# Patient Record
Sex: Female | Born: 1965
Health system: Southern US, Community
[De-identification: ages and names within clinical notes are randomized; demographics above are authoritative.]

## PROBLEM LIST (undated history)

## (undated) DIAGNOSIS — I10 Essential (primary) hypertension: Secondary | ICD-10-CM

## (undated) DIAGNOSIS — R7303 Prediabetes: Secondary | ICD-10-CM

## (undated) DIAGNOSIS — G4730 Sleep apnea, unspecified: Secondary | ICD-10-CM

## (undated) DIAGNOSIS — E7800 Pure hypercholesterolemia, unspecified: Secondary | ICD-10-CM

## (undated) DIAGNOSIS — H409 Unspecified glaucoma: Secondary | ICD-10-CM

## (undated) HISTORY — DX: Essential (primary) hypertension: I10

## (undated) HISTORY — PX: CATARACT EXTRACTION: SUR2

## (undated) HISTORY — PX: PELVIC LAPAROSCOPY: SHX162

## (undated) HISTORY — DX: Sleep apnea, unspecified: G47.30

## (undated) HISTORY — DX: Prediabetes: R73.03

## (undated) HISTORY — DX: Unspecified glaucoma: H40.9

## (undated) HISTORY — PX: OOPHORECTOMY: SHX86

## (undated) HISTORY — DX: Pure hypercholesterolemia, unspecified: E78.00

---

## 1998-08-27 ENCOUNTER — Other Ambulatory Visit: Admission: RE | Admit: 1998-08-27 | Discharge: 1998-08-27 | Payer: Self-pay | Admitting: Obstetrics and Gynecology

## 1999-05-17 HISTORY — PX: OTHER SURGICAL HISTORY: SHX169

## 1999-11-09 ENCOUNTER — Encounter (INDEPENDENT_AMBULATORY_CARE_PROVIDER_SITE_OTHER): Payer: Self-pay

## 1999-11-09 ENCOUNTER — Inpatient Hospital Stay (HOSPITAL_COMMUNITY): Admission: RE | Admit: 1999-11-09 | Discharge: 1999-11-12 | Payer: Self-pay | Admitting: Obstetrics and Gynecology

## 2000-03-13 ENCOUNTER — Ambulatory Visit (HOSPITAL_COMMUNITY): Admission: RE | Admit: 2000-03-13 | Discharge: 2000-03-13 | Payer: Self-pay | Admitting: Obstetrics and Gynecology

## 2000-03-13 ENCOUNTER — Encounter: Payer: Self-pay | Admitting: Obstetrics and Gynecology

## 2001-02-01 ENCOUNTER — Other Ambulatory Visit: Admission: RE | Admit: 2001-02-01 | Discharge: 2001-02-01 | Payer: Self-pay | Admitting: Obstetrics and Gynecology

## 2001-11-02 ENCOUNTER — Encounter: Admission: RE | Admit: 2001-11-02 | Discharge: 2001-11-02 | Payer: Self-pay | Admitting: *Deleted

## 2001-11-02 ENCOUNTER — Encounter: Payer: Self-pay | Admitting: *Deleted

## 2002-02-12 ENCOUNTER — Other Ambulatory Visit: Admission: RE | Admit: 2002-02-12 | Discharge: 2002-02-12 | Payer: Self-pay | Admitting: Obstetrics and Gynecology

## 2003-08-11 ENCOUNTER — Ambulatory Visit (HOSPITAL_COMMUNITY): Admission: RE | Admit: 2003-08-11 | Discharge: 2003-08-11 | Payer: Self-pay | Admitting: Gastroenterology

## 2003-08-11 ENCOUNTER — Encounter (INDEPENDENT_AMBULATORY_CARE_PROVIDER_SITE_OTHER): Payer: Self-pay | Admitting: *Deleted

## 2004-04-21 ENCOUNTER — Other Ambulatory Visit: Admission: RE | Admit: 2004-04-21 | Discharge: 2004-04-21 | Payer: Self-pay | Admitting: Obstetrics and Gynecology

## 2005-05-17 ENCOUNTER — Other Ambulatory Visit: Admission: RE | Admit: 2005-05-17 | Discharge: 2005-05-17 | Payer: Self-pay | Admitting: Obstetrics and Gynecology

## 2005-08-01 ENCOUNTER — Emergency Department (HOSPITAL_COMMUNITY): Admission: EM | Admit: 2005-08-01 | Discharge: 2005-08-01 | Payer: Self-pay | Admitting: Family Medicine

## 2005-08-07 ENCOUNTER — Emergency Department (HOSPITAL_COMMUNITY): Admission: EM | Admit: 2005-08-07 | Discharge: 2005-08-07 | Payer: Self-pay | Admitting: Emergency Medicine

## 2006-04-20 ENCOUNTER — Encounter: Admission: RE | Admit: 2006-04-20 | Discharge: 2006-04-20 | Payer: Self-pay | Admitting: Cardiology

## 2006-05-02 ENCOUNTER — Other Ambulatory Visit: Admission: RE | Admit: 2006-05-02 | Discharge: 2006-05-02 | Payer: Self-pay | Admitting: Gynecology

## 2007-05-04 ENCOUNTER — Other Ambulatory Visit: Admission: RE | Admit: 2007-05-04 | Discharge: 2007-05-04 | Payer: Self-pay | Admitting: Gynecology

## 2007-05-17 HISTORY — PX: ABDOMINAL HYSTERECTOMY: SHX81

## 2007-08-15 ENCOUNTER — Encounter: Admission: RE | Admit: 2007-08-15 | Discharge: 2007-08-15 | Payer: Self-pay | Admitting: Gynecology

## 2007-09-03 ENCOUNTER — Encounter: Admission: RE | Admit: 2007-09-03 | Discharge: 2007-09-03 | Payer: Self-pay | Admitting: Gynecology

## 2007-09-19 ENCOUNTER — Encounter: Payer: Self-pay | Admitting: Gynecology

## 2007-09-19 ENCOUNTER — Inpatient Hospital Stay (HOSPITAL_COMMUNITY): Admission: RE | Admit: 2007-09-19 | Discharge: 2007-09-21 | Payer: Self-pay | Admitting: Gynecology

## 2007-11-21 ENCOUNTER — Encounter: Admission: RE | Admit: 2007-11-21 | Discharge: 2007-11-21 | Payer: Self-pay | Admitting: Cardiology

## 2008-02-14 HISTORY — PX: HEEL SPUR SURGERY: SHX665

## 2008-05-05 ENCOUNTER — Other Ambulatory Visit: Admission: RE | Admit: 2008-05-05 | Discharge: 2008-05-05 | Payer: Self-pay | Admitting: Gynecology

## 2008-05-05 ENCOUNTER — Ambulatory Visit: Payer: Self-pay | Admitting: Gynecology

## 2008-08-04 ENCOUNTER — Encounter: Admission: RE | Admit: 2008-08-04 | Discharge: 2008-08-04 | Payer: Self-pay | Admitting: Cardiology

## 2009-05-12 ENCOUNTER — Other Ambulatory Visit: Admission: RE | Admit: 2009-05-12 | Discharge: 2009-05-12 | Payer: Self-pay | Admitting: Gynecology

## 2009-05-12 ENCOUNTER — Ambulatory Visit: Payer: Self-pay | Admitting: Gynecology

## 2009-08-06 ENCOUNTER — Encounter: Admission: RE | Admit: 2009-08-06 | Discharge: 2009-08-06 | Payer: Self-pay | Admitting: Cardiology

## 2009-10-07 ENCOUNTER — Ambulatory Visit: Payer: Self-pay | Admitting: Gynecology

## 2010-05-16 HISTORY — PX: EYE SURGERY: SHX253

## 2010-05-21 ENCOUNTER — Ambulatory Visit
Admission: RE | Admit: 2010-05-21 | Discharge: 2010-05-21 | Payer: Self-pay | Source: Home / Self Care | Attending: Gynecology | Admitting: Gynecology

## 2010-05-21 ENCOUNTER — Other Ambulatory Visit
Admission: RE | Admit: 2010-05-21 | Discharge: 2010-05-21 | Payer: Self-pay | Source: Home / Self Care | Admitting: Gynecology

## 2010-06-06 ENCOUNTER — Encounter: Payer: Self-pay | Admitting: Gynecology

## 2010-09-28 NOTE — Op Note (Signed)
Jillian [name], Jillian [name]            ACCOUNT NO.:  [id number]   MEDICAL RECORD NO.:  [id number]          PATIENT TYPE:  AMB   LOCATION:  SDC                           FACILITY:  [facility]   PHYSICIAN:  Timothy P. Fontaine, M.D.DATE OF BIRTH:  Jan 09, 1966   DATE OF PROCEDURE:  09/19/2007  DATE OF DISCHARGE:                               OPERATIVE REPORT   PREOPERATIVE DIAGNOSES:  1. Leiomyoma.  2. Hydrosalpinx.  3. Menorrhagia.  4. Dysmenorrhea.   POSTOPERATIVE DIAGNOSES:  1. Leiomyoma.  2. Hydrosalpinx.  3. Menorrhagia.  4. Dysmenorrhea.   PROCEDURE:  1. Total abdominal hysterectomy with bilateral salpingo-oophorectomy.  2. Lysis of adhesions.   SURGEON:  Timothy P. Fontaine, MD   ASSISTANT:  Rande Brunt. Eda Paschal, MD   ANESTHESIA:  General.   COMPLICATIONS:  None.   ESTIMATED BLOOD LOSS:  300 mL.   SPECIMEN:  Uterus, bilateral fallopian tubes, bilateral ovaries,  clinical weight 687 grams.   FINDINGS:  Uterus enlarged with multiple myomas.  Right and left ovaries  grossly normal.  Left fallopian tube grossly normal.  Right fallopian  tube with hydrosalpinx.  Multiple adhesions involving sigmoid colon,  posterior cul-de-sac, posterior uterus extending over the anterior  uterine surface.  All lysed.  No evidence of endometriosis.  Appendix  grossly normal.  Abdominal exam palpably within normal limits.   PROCEDURE IN DETAIL:  The patient was taken to the operating room,  underwent general anesthesia, and received abdominal, perineal, vaginal  preparation with Betadine solution.  A indwelling Foley catheterization  was placed and the patient was draped in usual fashion.  The abdomen was  sharply entered through repeat midline incision excising the old scar  upon entry.  Examination of the pelvic organs revealed significant  intestinal adhesions along the anterior and posterior uterine surfaces  which were ultimately lysed to allow visualization and mobilization of  the  uterus.  Subsequently, a Balfour retractor was placed within the  incision.  The intestines were packed from the operative site.  Uterus  elevated through the incision.  Right round ligament identified,  transected with electrocautery, and due to the bulk of the uterus  initially the right ovary was left intact, double clamped, cut, and  ligating the uterine ovarian pedicle using 0 Vicryl suture.  The  anterior vesicouterine fold was sharply incised.  The bladder flap was  initially developed sharply.  The uterine vessels skeletonized and  clamped, cut, and doubly ligated using 0 Vicryl suture.  On the left  adnexa, the left round ligament was identified, ligated using 0 Vicryl  suture and transected, and subsequently the left infundibulopelvic  ligament and vessels were identified, skeletonized, doubly clamped, cut,  and doubly ligated using 0 Vicryl suture.  The left uterine vessels were  skeletonized, doubly clamped, cut, and doubly ligated using 0 Vicryl  suture.  Due to the bulk of the uterus, a supracervical hysterectomy was  initially performed to allow visualization of the lower uterine segment.  Cervix and the vesicouterine fold was progressively sharply developed  and subsequently the cervix was freed from its attachments through  progressive clamping, cutting, and ligating  of the paracervical cardinal  ligaments using 0 Vicryl suture.  The vagina was then cross clamped, the  cervix was excised from the patient, and right and left angled sutures  were placed, ligated using 0 Vicryl suture, and tagged for future  reference.  The vagina was then closed anterior to posterior with  sequential figure-of-eight 0 Vicryl sutures.  The right adnexa was then  elevated.  The right hydrosalpinx was identified along with the ovary.  The infundibulopelvic ligament and vessels were skeletonized, doubly  clamped, cut, and doubly ligated using 0 Vicryl suture.  The pelvis was  then copiously  irrigated.  Several bleeding sites were identified,  isolated, and ligated using 3-0 Vicryl.  Copious irrigation revealed  adequate hemostasis.  The bowel packing was then removed.  Balfour  retractor and bladder blade removed and the anterior fascia was  reapproximated using 0 PDS in a looped running stitch starting at the  angle, meeting in the middle.  Subcutaneous tissues were irrigated.  Hemostasis was achieved with electrocautery.  Skin reapproximated with  staples.  Sterile dressing applied.  The patient was awakened without  difficulty and taken to recovery room in good condition having tolerated  the procedure well.      Timothy P. Fontaine, M.D.  Electronically Signed     TPF/MEDQ  D:  09/19/2007  T:  09/20/2007  Job:  [id number]

## 2010-09-28 NOTE — Discharge Summary (Signed)
[name], [name]            ACCOUNT NO.:  [id number]   MEDICAL RECORD NO.:  [id number]          PATIENT TYPE:  INP   LOCATION:  9305                          FACILITY:  [facility]   PHYSICIAN:  Timothy P. Fontaine, M.D.DATE OF BIRTH:  [birth year]   DATE OF ADMISSION:  09/19/2007  DATE OF DISCHARGE:                               DISCHARGE SUMMARY   DISCHARGE DIAGNOSES:  1. Leiomyomata.  2. Pelvic adhesion disease.  3. Adenomyosis.  4. Chronic salpingitis.   PROCEDURES:  Total abdominal hysterectomy on 09/19/2007, pathology [id number]  1634 cervix with chronic cervicitis, squamous metaplasia, benign  secretory endometrium, adenomyosis, leiomyomata, serous fibrous  adhesions, bilateral ovaries and fallopian tubes with chronic  salpingitis, follicular cysts, paratubal cyst, and fibrous adhesions.   HOSPITAL COURSE:  The patient underwent uncomplicated TAHBSO on  09/19/2007.  Findings at the time of surgery, was an enlarged uterus at  687 grams with intestinal to uterine adhesions from her prior  myomectomy.  The surgery was uncomplicated.  Her postoperative course  was uncomplicated with the patient being discharged on postoperative day  #2, ambulating well, tolerating a regular diet, passing flatus, voiding  without difficulty, and a postoperative hemoglobin of 9.6.  The patient  received precautions, instructions, and followup.  Will be seen in the  office several days following discharge for staple removal, and she  received a prescription for Tylox #30 1-2 p.o. q.4-6 h. p.r.n. pain.      Timothy P. Fontaine, M.D.  Electronically Signed     TPF/MEDQ  D:  09/21/2007  T:  09/21/2007  Job:  [id number]

## 2010-09-28 NOTE — H&P (Signed)
NAMEDOLOREZ, Jillian [name]            ACCOUNT NO.:  [id number]   MEDICAL RECORD NO.:  [id number]          PATIENT TYPE:  AMB   LOCATION:  SDC                           [facility]ILITY[facility]   PHYSICIAN:  Timothy P. Fontaine, M.D.DATE OF BIRTH:  02/29/1966   DATE OF ADMISSION:  DATE OF DISCHARGE:                              HISTORY & PHYSICAL   CHIEF COMPLAINT:  Menorrhagia, dysmenorrhea, leiomyoma, hydrosalpinx.   HISTORY OF PRESENT ILLNESS:  A 45 year old G1 P0 female with increasing  dysmenorrhea, menorrhagia, with history of leiomyoma and probable right  hydrosalpinx.  The patient is status post history of multiple myomectomy  in the past and laparoscopy. Has had a negative endometrial biopsy and  an ultrasound sonohistogram demonstrating multiple 47 mm with a probable  right hydrosalpinx measuring 85 x 41 mm.  Options for continued  management were reviewed with the patient to include continued expectant  management, repeat myomectomy, uterine artery embolization,  hysterectomy, all of which were reviewed.  The patient wants to proceed  with hysterectomy.  The options for ovarian conservation versus  bilateral salpingo-oophorectomy was reviewed with her also. The issues  of retaining her ovaries for continued hormonal production with the  risks of reoperation due to benign ovarian disease such as cysts and  pain and the risks of ovarian cancer long-term versus removing her  ovaries now and the risks of hypoestrogenism both from a symptomatic  standpoint as well as hypoestrogenism, cardiovascular risks, and bone  health reviewed.  Options for ERT following TAH-BSO were reviewed.  Women's Health Initiative data discussed and the possible increased  risks of breast cancer, coagulability such as stroke, heart attack, DVT  was all discussed, understood and accepted.  After a lengthy discussion  and consideration by the patient, she wants to proceed with removal of  both ovaries and she  understands and accepts the potential for ERT  following the procedure and the possible increase risks of breast  cancer, stroke, heart attack, DVT and other risks associated with the  [facility]. Sexuality following hysterectomy was also  reviewed. The potential for persistent orgasmic dysfunction as well as  persistent dyspareunia.  The absolute irreversible sterility associated  with hysterectomy was reviewed with the patient, particularly in the  light that she does not have any children and she clearly understands  the absolute irreversible sterility associated with hysterectomy,  bilateral salpingo-oophorectomy and is comfortable with this decision  and accepts this.  The acute intraoperative/postoperative risks were  reviewed with her to include the risk of infection, to include  generalized pelvic requiring prolonged antibiotics, abscess formation or  hematoma formation requiring reoperation, abscess, hematoma drainage,  wound complications requiring opening and draining of incisions, closure  by secondary intention, long-term risks of incisions to include  scarring, keloid formation as well as hernia formation.  She has a  midline incision with a fairly broad scar and will plan on excising this  and attempting to revise the incision during the procedure.  She  understands that we will be going through the midline incision again.  The risks of hemorrhage necessitating transfusion and risks of  transfusion including transfusion reaction, hepatitis, HIV, Mad cow  disease and other unknown entities was discussed, understood and  accepted.  The risks of inadvertent injury to internal organs including  bowel, bladder, ureters, vessels and nerves necessitating major  exploratory reparative surgeries, future reparative surgeries, bowel  resection, bladder repair, ureteral repair, reimplantation as well as  potential for ostomy formation was all discussed, understood and   accepted.  The patient's questions were answered to her satisfaction and  she is ready to proceed with surgery.   PMH  Hypertension   PSH  Myomectomy, Laparoscopy   Medications  Exforge   Allergies  None   Family History  Noncontributory   Social History  Noncontributory   Physical Exam   BP  120/84 VSS  Heent  WNL  Lungs  Clear  Cardiac RR without RMG  Abd  Benign  Pelvic  Ext BUS Vag  WNL    Cx WNL    Uterus 14 weeksa size C/W leiomyomata    Adenexa  WNL    R/V  WNL   Assessment/Plan  45 year old G1 P0 female with increasing dysmenorrhea,  menorrhagia, with history of leiomyoma and probable right hydrosalpinx  for TAH/BSO as per above counseling.      Timothy P. Fontaine, M.D.  Electronically Signed     TPF/MEDQ  D:  09/03/2007  T:  09/03/2007  Job:  [id number]

## 2010-10-01 NOTE — Discharge Summary (Signed)
[facility]  Patient:    Jillian [name], Jillian [name]                   MRN: [id number] Adm. Date:  [id number] Disc. Date: 11/12/99 Attending:  Miguel Aschoff                           Discharge Summary  ADMISSION DIAGNOSIS:  Symptomatic leiomyomata.  POSTOPERATIVE DIAGNOSIS:  Symptomatic leiomyomata.  OPERATION AND PROCEDURES:  Laparotomy with multiple myomectomies, general anesthesia.  BRIEF HISTORY:  The patient is a 45 year old black female who had a very large uterine fibroid equivalent to 18 week size.  The patient had significant discomfort from these fibroids and after being offered alternative treatments including Lupron therapy, hysterectomy and myomectomy the patient elected that she desired only to have myomectomy performed since she is still considering future pregnancy.  She was therefore admitted to the hospital to undergo myomectomy with the understanding that it might be necessary to proceed with hysterectomy if myomectomy cannot be done without excessive blood loss.  HOSPITAL COURSE:  The patient was admitted.  Her hemoglobin on admission was 13.5, white count 4800, PT and PTT were within normal limits.  Chemistry profile was within normal limits.  Urinalysis was negative.  HOSPITAL COURSE:  On June 26, the patient underwent laparotomy at which time she was noted to have a markedly enlarged uterus with both intramural, intraligamentous and multiple subserosal leiomyomata.  Myomectomy was carried out with removal of 17 leiomyomas which ranged in size from 0.8 to 14 cm.  The total weight of the leiomyomata was 636 g.  The patient had essentially uncomplicated course, however, her hemoglobin did drop down to 9.6, white count reached 8100.  She did develop low-grade temperature which resolved by the third postoperative day.  She tolerated increasing ambulation and diet well and by November 12, 1999, was felt to be in satisfactory condition and able to  be discharged home.  She was sent home on June 29.  MEDICATIONS FOR HOME:  Included Tylox one q.3-4h. as needed for pain, Floxin 300 mg b.i.d. times seven days, Chromagen 1 p.o. q.d.  She was instructed to return to our office on October 16, 1999, to undergo staple removal.  She is to call if there are any problems such as fever, pain or heavy bleeding.  She was instructed to do no heavy lifting.  She was sent home on a regular diet in satisfactory condition. DD:  11/12/99 TD:  11/12/99 Job: [id number]

## 2010-10-01 NOTE — H&P (Signed)
[facility]  Patient:    Jillian [name], Jillian [name]                     MRN: [id number] Adm. Date:  11/09/99 Attending:  Miguel Aschoff, M.D.                         History and Physical  ADMISSION DIAGNOSES:  Multiple uterine fibroids, pelvic pain, menorrhagia.  HISTORY:  The patient is a 45 year old black female who has a history of rapidly enlarging uterine fibroids.  When initially seen her fibroids, in 1999, were approximately 11-12 week size.  At this time they are approximately 18 week size.  These are associated with pelvic pain and heavy vaginal bleeding.  They have been treated in the past with Lupron therapy.  Because of the symptoms now currently associated with the fibroids, the patient has requested that a definitive treatment be carried out.  However, since she has no living children she would like to avoid a hysterectomy and is therefore being brought to the hospital at this time to undergo a myomectomy, but the patient clearly understands that a hysterectomy may be necessitated if intraoperative findings indicate that this procedure should be carried out. The patient denies any history of metrorrhagia.  Her last Pap smear of August 27, 1998 was satisfactory and within normal limits.  She denies any history of dysuria, frequency, or urgency.  There is no history of nausea, vomiting, diarrhea, melena, or hematochezia.  PAST MEDICAL HISTORY:  Positive for previous laparoscopy for treatment of hydrosalpinx.  The patient has undergone a prior course of in vitro fertilization which was not successful.  Past medical history other than the prior surgery, denies any history of high blood pressure, diabetes, or heart disease.  She has been treated for prior anemia thought secondary to blood loss secondary to her uterine fibroids.  ALLERGIES:  Allergic history is negative.  FAMILY HISTORY:  Noncontributory.  SOCIAL HISTORY:  The patient is married.  She  denies the use of alcohol or drugs.  She has no children.  REVIEW OF SYSTEMS:  Review of the head, ears, nose and throat is negative. Cardiorespiratory review is negative for shortness of breath, cough, or hemoptysis.  GI review:  See present illness.  Neuromuscular review is negative.  GU review:  See present illness.  GYN review:  See present illness.  PHYSICAL EXAMINATION:  GENERAL:  The patient is a well-developed, well-nourished, black female in no acute distress.  VITAL SIGNS:  Temperature 98.4, pulse 76, respirations 16, blood pressure 120/80.  Weight 164.  Height 5 feet 5 inches.  HEENT:  The head is normocephalic, atraumatic.  Eyes shows pupils to be equal and reactive to light and accommodation.  Sclerae white.  Conjunctivae are pink.  Extraocular muscles have full range of motion.  Nose patent without gross lesion.  Tongue is moist in midline.  The throat is clear.  NECK:  Supple with no adenopathy noted.  No thyromegaly is noted.  BREASTS:  Nontender, no masses are found.  HEART:  Regular rhythm with no murmur or gallop.  ABDOMEN:  Soft.  There is no evidence of enlargement of liver, kidneys, or spleen.  There is a large mass present up to the level of the umbilicus consistent with large uterine fibroids.  Bowel sounds are active.  There is no rebound or guarding.  BACK:  No CVA tenderness or deformity.  PELVIC:  Normal external genitalia.  Normal Bartholins, urethral, Skenes glands.  Normal urethra.  Vaginal vault is without gross lesion.  Cervix is without gross lesion.  The uterus is markedly enlarged, irregular in shape, consistent with uterine fibroids at approximately 18 weeks equivalent size. Adnexa are difficult to assess because of the uterine fibroids.  Rectovaginal exam is confirmatory.  EXTREMITIES:  No clubbing, cyanosis or edema.  SKIN:  Without gross lesion.  NEUROLOGIC:  Intact.  IMPRESSION:  Multiple uterine fibroids, with menorrhagia and  pelvic pain.  PLAN:  Plan is for the patient to undergo myomectomy if possible.  If this cannot be done satisfactory, then hysterectomy is to be performed. DD:  11/09/99 TD:  11/09/99 Job: [id number]

## 2010-10-01 NOTE — Op Note (Signed)
Jillian [name], DORVAL A                      ACCOUNT NO.:  [id number]   MEDICAL RECORD NO.:  [id number]                   PATIENT TYPE:  AMB   LOCATION:  ENDO                                 FACILITY:  [facility]   PHYSICIAN:  Anselmo Rod, M.D.               DATE OF BIRTH:  Oct 10, 1965   DATE OF PROCEDURE:  08/11/2003  DATE OF DISCHARGE:                                 OPERATIVE REPORT   PROCEDURE PERFORMED:  Colonoscopy with biopsies x 2.   ENDOSCOPIST:  Anselmo Rod, M.D.   INSTRUMENT USED:  Olympus video colonoscope.   INDICATION FOR PROCEDURE:  A 45 year old [place] female with a  history of rectal bleeding.  Rule out colonic polyps, masses, etc.   PREPROCEDURE PREPARATION:  Informed consent was procured from the patient.  The patient fasted for eight hours prior to the procedure.  She was prepped  with a bottle of magnesium citrate and a gallon of GoLYTELY the night prior  to the procedure.   PREPROCEDURE PHYSICAL EXAMINATION:  VITAL SIGNS:  Stable.  NECK:  Supple.  CHEST:  Clear to auscultation.  HEART:  S1 and S2 regular.  ABDOMEN:  Soft with normal bowel sounds.  No masses palpable.   DESCRIPTION OF PROCEDURE:  The patient was placed in the left lateral  decubitus position.  Sedated with 100 mg of Demerol and 8 mg of Versed  intravenously. Once the patient was adequately sedated and maintained on low-  flow oxygen and continuous cardiac monitoring, the Olympus video colonoscope  was advanced from the rectum to the cecum.  A small polyp was biopsied from  the proximal right colon x 2.  Small internal hemorrhoids were seen on  retroflexion.  No other masses or polyps were identified.  The entire  colonic mucosa appeared health with a normal vascular pattern.  The patient  tolerated the procedure well without immediate complications.   IMPRESSION:  1. Small internal hemorrhoids.  2. Small sessile polyp biopsied x 2 from proximal right colon.  3. Otherwise  unrevealing colonoscopy.   RECOMMENDATIONS:  1. Proceed with EGD at this time.  2. Avoid nonsteroidals, including aspirin for now.  3. Await pathology results.  4. Repeat CRC screening depending on pathology results.  5. Continue a high-fiber diet with liberal fluid intake.  6. Further recommendations to be made after the EGD has been done.                                               Anselmo Rod, M.D.    JNM/MEDQ  D:  08/12/2003  T:  08/12/2003  Job:  [id number]   cc:   Gabriel Earing, M.D.  [address]  [place]  [place] [address]  Fax: [phone]

## 2010-10-01 NOTE — Op Note (Signed)
[facility]  Patient:    Jillian [name], Jillian [name]                   MRN: [id number] Proc. Date: 11/09/99 Adm. Date:  [id number] Attending:  Miguel Aschoff                           Operative Report  PREOPERATIVE DIAGNOSIS:  Multiple uterine leiomyomata.  POSTOPERATIVE DIAGNOSIS:  Multiple uterine leiomyomata.  PROCEDURE:  Multiple myomectomies.  SURGEON:  Miguel Aschoff, M.D.  ASSISTANT:  Marcelle Overlie, M.D.  ANESTHESIA:  General.  COMPLICATIONS:  None.  JUSTIFICATION:  The patient is a 45 year old black female known to have a large  mass almost to the level of the umbilicus consistent with uterine fibroids. The patient has requested that the fibroids be removed if possible sparing the uterus, but she understands because of the nature of the surgery that hysterectomy might have to be carried out.  With this information, she has given her informed consent for myomectomy and possible hysterectomy.  DESCRIPTION OF PROCEDURE:  The patient was taken to the operating room and placed in the supine position.  General anesthesia was administered without difficulty. She was prepped and draped in the usual sterile fashion.  A Foley catheter was inserted.  A midline incision was then made from the symphysis to the umbilicus. This was extended down through the subcutaneous tissue, with bleeding points being electrocoagulated as they were encountered.  The fascia was then identify and incised vertically.  The midline was then identified and the rectus muscles were divided.  The peritoneum was then revealed and entered, being careful to avoid underlying structures.  At this point, a self retaining retractor was placed through the wound.  The viscera were packed out of the pelvis.  At this point, he patient had markedly enlarged uterus with multiple subserosal fibroids, the biggest bulk of the fibroids being anterior and fundal, approximately 10 cm in  size. There were multiple other, both intramural and subserosal fibroids.  ______ clamp was  used to grasp the largest fibroid.  This elevated the uterus and the base of the this large mass of fibroids was injected with Pitressin solution, 1 unit to 20 c. At this point, dissection was carried out, removing this large mass of fibroids by creating basically a V-shaped incision in the serosa into myometrium at the base of this large bulk of fibroids.  This clump was then removed without difficulty. he bleeding points were identified, clamped with hemostats and cauterized.  The defect was closed using multiple interrupted sutures of 0 Vicryl, closing the bed of the fibroid site.  After this was done, the defect was closed in multiple layers of  interrupted 0 Vicryl, with the serosal layer being closed with running interlocking layer of 0 Vicryl suture.  There appeared to be excellent hemostasis present once this anterior mass of fibroids was removed.  At this point, attention was directed to the multiple other subserosal fibroids.  These were taken off without difficulty and the defect was close with figure-of-eight sutures of 0 Vicryl.  Then, a large posterior fundal fibroid was identified in intramural position.  This was dissected out, followed by dissection of another intramural fibroid just below it.  The endometrial cavity was not entered at any time.  This defect was closed in a similar fashion using multiple interrupted sutures of 0 Vicryl and closing three layers with a running interlocking layer of 0  Vicryl at the serosa.  All of the  remaining subserosal fibroids were removed and the uterus was now restored to normal size.  There was a right interligamentous fibroid present.  It was elected not to remove this fibroid for fear of causing excessive bleeding, increasing the risk of hysterectomy.  At this point, final inspection was made for hemostasis.  The abdomen  was irrigated with copious amounts of warm saline.  There appeared o be excellent hemostasis.  The fundus of the uterus was then covered with a sheet of Interceed.  Lap counts were taken and found to be correct.  The abdomen was closed. The parietal peritoneum was closed using running interlocking 0 Vicryl suture. The fascia was closed using interrupted 0 Vicryl sutures.  The subcutaneous tissue as closed using interrupted 0 Vicryl sutures and the skin incision was closed using staples.  Estimated blood loss was approximately 200 cc.  The patient tolerated the procedure well and went to the recovery room in satisfactory condition. DD:  11/09/99 TD:  11/10/99 Job: [id number]

## 2010-10-01 NOTE — Op Note (Signed)
[name], Jillian [name]                      ACCOUNT NO.:  [id number]   MEDICAL RECORD NO.:  [id number]                   PATIENT TYPE:  AMB   LOCATION:  ENDO                                 FACILITY:  [facility]   PHYSICIAN:  Anselmo Rod, M.D.               DATE OF BIRTH:  25-Mar-1966   DATE OF PROCEDURE:  08/11/2003  DATE OF DISCHARGE:                                 OPERATIVE REPORT   PROCEDURE:  Esophagogastroduodenoscopy.   ENDOSCOPIST:  Anselmo Rod, M.D.   INSTRUMENT USED:  Olympus video panendoscope.   INDICATIONS FOR PROCEDURE:  [name] 45 year old African-American female with [name]  history of epigastric pain and rectal bleeding, rule out peptic ulcer  disease, esophagitis, gastritis, etc.  The patient's colonoscopy was  unrevealing.   PREPROCEDURE PREPARATION:  Informed consent was procured from the patient.  The patient fasted for eight hours prior to the procedure.   PREPROCEDURE HISTORY AND PHYSICAL:  The patient had stable vital signs. Neck  supple. Chest clear to auscultation. S1, S2 regular. Abdomen soft with  normal bowel sounds.   DESCRIPTION OF PROCEDURE:  The patient was placed in the left lateral  decubitus position, sedated with Demerol and Versed for the EGD.  No  additional sedation was used for the colonoscopy.  Once the patient was  adequately sedated and maintained on low flow oxygen and continuous cardiac  monitoring, the Olympus video panendoscope was advanced with [name] mouthpiece  over the tongue into the esophagus under direct vision. The entire esophagus  appeared normal with no evidence of ring, stricture, masses, esophagitis, or  Barrett's mucosa. The scope was then advanced to the stomach and [name] small  hiatal hernia was seen on high retroflexion. The rest of the gastric mucosa  in the proximal bowel appeared normal.   IMPRESSION:  Normal EGD except for [name] small hiatal hernia.   RECOMMENDATIONS:  1. Avoid nonsteroidals.  2. Continue [name] high fiber diet  with liber fluid intake, stool softeners to be     taken as needed. Avoid laxatives.  3. Outpatient followup in the next two weeks for further recommendations.                                               Anselmo Rod, M.D.    JNM/MEDQ  D:  08/12/2003  T:  08/12/2003  Job:  [id number]   cc:   Gabriel Earing, M.D.  [address]  [place]  [place] [address]  Fax: [phone]

## 2010-10-18 ENCOUNTER — Other Ambulatory Visit: Payer: Self-pay | Admitting: Internal Medicine

## 2010-10-18 DIAGNOSIS — Z1231 Encounter for screening mammogram for malignant neoplasm of breast: Secondary | ICD-10-CM

## 2010-10-25 ENCOUNTER — Ambulatory Visit
Admission: RE | Admit: 2010-10-25 | Discharge: 2010-10-25 | Disposition: A | Discharge status: Home / Self Care | Payer: BC Managed Care – PPO | Source: Ambulatory Visit | Attending: Internal Medicine | Admitting: Internal Medicine

## 2010-10-25 DIAGNOSIS — Z1231 Encounter for screening mammogram for malignant neoplasm of breast: Secondary | ICD-10-CM

## 2011-05-18 DIAGNOSIS — I10 Essential (primary) hypertension: Secondary | ICD-10-CM | POA: Insufficient documentation

## 2011-05-26 ENCOUNTER — Encounter: Payer: Self-pay | Admitting: Gynecology

## 2011-05-26 ENCOUNTER — Ambulatory Visit (INDEPENDENT_AMBULATORY_CARE_PROVIDER_SITE_OTHER): Payer: BC Managed Care – PPO | Admitting: Gynecology

## 2011-05-26 VITALS — BP 112/80 | Ht 64.5 in | Wt 178.0 lb

## 2011-05-26 DIAGNOSIS — Z13220 Encounter for screening for lipoid disorders: Secondary | ICD-10-CM

## 2011-05-26 DIAGNOSIS — Z01419 Encounter for gynecological examination (general) (routine) without abnormal findings: Secondary | ICD-10-CM

## 2011-05-26 DIAGNOSIS — Z131 Encounter for screening for diabetes mellitus: Secondary | ICD-10-CM

## 2011-05-26 LAB — URINALYSIS W MICROSCOPIC + REFLEX CULTURE
Leukocytes, UA: NEGATIVE
Nitrite: NEGATIVE
Protein, ur: NEGATIVE mg/dL
Specific Gravity, Urine: 1.02 (ref 1.005–1.030)
Urobilinogen, UA: 0.2 mg/dL (ref 0.0–1.0)

## 2011-05-26 NOTE — Progress Notes (Addendum)
Jillian [name] [birth year] [id number]        46 y.o.  for annual exam.  Doing well no complaints.  Past medical history,surgical history, medications, allergies, family history and social history were all reviewed and documented in the EPIC chart. ROS:  Was performed and pertinent positives and negatives are included in the history.  Exam: Jillian [name] chaperone present Filed Vitals:   05/26/11 1536  BP: 112/80   General appearance  Normal Skin grossly normal Head/Neck normal with no cervical or supraclavicular adenopathy thyroid normal Lungs  clear Cardiac RR, without RMG Abdominal  soft, nontender, without masses, organomegaly or hernia Breasts  examined lying and sitting without masses, retractions, discharge or axillary adenopathy. Pelvic  Ext/BUS/vagina  normal   Adnexa  Without masses or tenderness    Anus and perineum  normal   Rectovaginal  normal sphincter tone without palpated masses or tenderness.    Assessment/Plan:  46 y.o. female for annual exam.    1. Status post TAH/BSO. On no ERT doing well. 2. Bone health. DEXA 09/2009 was normal. Increased calcium vitamin D reviewed. We'll repeat DEXA in five-year interval. 3. Mammography. Mammogram 10/2010 normal. We'll continue with annual mammography. SBE monthly reviewed. 4. Health maintenance. Baseline CBC lipid profile glucose urinalysis ordered. Patient will see me in a year assuming she continues well, sooner as needed.    Dara Lords MD, 4:12 PM 05/26/2011

## 2011-05-26 NOTE — Patient Instructions (Signed)
Follow-up in one year.

## 2011-05-27 LAB — CBC WITH DIFFERENTIAL/PLATELET
Basophils Absolute: 0 10*3/uL (ref 0.0–0.1)
Basophils Relative: 0 % (ref 0–1)
Eosinophils Absolute: 0.1 10*3/uL (ref 0.0–0.7)
HCT: 39.5 % (ref 36.0–46.0)
Hemoglobin: 12.6 g/dL (ref 12.0–15.0)
MCH: 27.9 pg (ref 26.0–34.0)
MCHC: 31.9 g/dL (ref 30.0–36.0)
Monocytes Absolute: 0.3 10*3/uL (ref 0.1–1.0)
Monocytes Relative: 7 % (ref 3–12)
RDW: 14.4 % (ref 11.5–15.5)

## 2011-05-27 LAB — LIPID PANEL
LDL Cholesterol: 139 mg/dL — ABNORMAL HIGH (ref 0–99)
Total CHOL/HDL Ratio: 3.8 Ratio
VLDL: 16 mg/dL (ref 0–40)

## 2011-06-15 ENCOUNTER — Other Ambulatory Visit: Payer: Self-pay | Admitting: *Deleted

## 2011-06-15 DIAGNOSIS — E7800 Pure hypercholesterolemia, unspecified: Secondary | ICD-10-CM

## 2011-11-09 ENCOUNTER — Other Ambulatory Visit: Payer: Self-pay | Admitting: Internal Medicine

## 2011-11-09 DIAGNOSIS — Z1231 Encounter for screening mammogram for malignant neoplasm of breast: Secondary | ICD-10-CM

## 2011-11-25 ENCOUNTER — Ambulatory Visit
Admission: RE | Admit: 2011-11-25 | Discharge: 2011-11-25 | Disposition: A | Discharge status: Home / Self Care | Payer: BC Managed Care – PPO | Source: Ambulatory Visit | Attending: Internal Medicine | Admitting: Internal Medicine

## 2011-11-25 DIAGNOSIS — Z1231 Encounter for screening mammogram for malignant neoplasm of breast: Secondary | ICD-10-CM

## 2012-06-01 ENCOUNTER — Encounter: Payer: Self-pay | Admitting: Gynecology

## 2012-06-01 ENCOUNTER — Ambulatory Visit (INDEPENDENT_AMBULATORY_CARE_PROVIDER_SITE_OTHER): Payer: BC Managed Care – PPO | Admitting: Gynecology

## 2012-06-01 VITALS — BP 124/78 | Ht 65.0 in | Wt 182.0 lb

## 2012-06-01 DIAGNOSIS — Z01419 Encounter for gynecological examination (general) (routine) without abnormal findings: Secondary | ICD-10-CM

## 2012-06-01 DIAGNOSIS — N952 Postmenopausal atrophic vaginitis: Secondary | ICD-10-CM

## 2012-06-01 DIAGNOSIS — H409 Unspecified glaucoma: Secondary | ICD-10-CM | POA: Insufficient documentation

## 2012-06-01 MED ORDER — ESTRADIOL 10 MCG VA TABS: 1.0000 | ORAL_TABLET | VAGINAL | Status: DC

## 2012-06-01 NOTE — Patient Instructions (Signed)
Tried the Vagifem as we discussed. Use 1 tablet nightly for 12 nights and then one tablet twice weekly. Call me if you have any questions or issues. Follow up in one year for annual exam. Schedule your mammogram in July 2014.

## 2012-06-01 NOTE — Progress Notes (Addendum)
Jillian [name] [birth year] [id number]        47 y.o.  G1P0010 for annual exam.  Several issues that are below.  Past medical history,surgical history, medications, allergies, family history and social history were all reviewed and documented in the EPIC chart. ROS:  Was performed and pertinent positives and negatives are included in the history.  Exam: Kim assistant Filed Vitals:   06/01/12 1102  BP: 124/78  Height: 5\' 5"  (1.651 m)  Weight: 182 lb (82.555 kg)   General appearance  Normal Skin grossly normal Head/Neck normal with no cervical or supraclavicular adenopathy thyroid normal Lungs  clear Cardiac RR, without RMG Abdominal  soft, nontender, without masses, organomegaly or hernia Breasts  examined lying and sitting without masses, retractions, discharge or axillary adenopathy. Pelvic  Ext/BUS/vagina  normal   Adnexa  Without masses or tenderness    Anus and perineum  normal   Rectovaginal  normal sphincter tone without palpated masses or tenderness.    Assessment/Plan:  47 y.o. G32P0010 female for annual exam.   1. Status post TAH/BSO. Some hot flushes and night sweats. Most bothered by vaginal dryness. Does not have dyspareunia but just dryness on a daily basis. Also noted some fatigue.  I reviewed menopausal symptoms with her and options. We have discussed HRT in the past and she has been reluctant. I discussed the advantages from a symptom relief, bone health and cardiovascular risk standpoint. I reviewed the risks to include the WHI study with stroke heart attack DVT and breast cancer. I reviewed with estrogen alone at her age transdermal application the risk of thrombosis or a low and no significant increase in breast cancer per most recent studies. Patient still is reluctant does not want to start. Her main complaint is vaginal dryness I reviewed options to include moisturizers, estrogen vaginal cream, Vagifem and Osphena. After lengthy discussion of the pros/cons,  risks/benefits she wants to try Vagifem. I discussed the limited absorption issues. And gave her samples for 2 weeks nightly then twice weekly for 3 following weeks. Assuming she does well with this I prescribed an annual refill for her. She has any questions or issues she'll call me. 2. Pap smear 2012. No Pap smear done today. No history of abnormal Pap smears. I reviewed current screening guidelines and the options to stop altogether she is status post hysterectomy for benign indications versus less frequent screening and we'll review on an annual basis. 3. Mammography 11/2011. Continue with annual mammography. SBE monthly reviewed. 4. Bone density. Patient had DEXA 09/2009 which is normal. Recommend repeat at 5 year interval. 5. Health maintenance.  The blood work done today as it is all done through her primary physician's office. Follow up one year, sooner as needed.    Dara Lords MD, 11:51 AM 06/01/2012

## 2012-06-02 LAB — URINALYSIS W MICROSCOPIC + REFLEX CULTURE
Bilirubin Urine: NEGATIVE
Casts: NONE SEEN
Crystals: NONE SEEN
Glucose, UA: NEGATIVE mg/dL
Specific Gravity, Urine: 1.016 (ref 1.005–1.030)
Urobilinogen, UA: 0.2 mg/dL (ref 0.0–1.0)
pH: 6.5 (ref 5.0–8.0)

## 2012-11-30 ENCOUNTER — Other Ambulatory Visit: Payer: Self-pay

## 2012-11-30 DIAGNOSIS — Z1231 Encounter for screening mammogram for malignant neoplasm of breast: Secondary | ICD-10-CM

## 2012-12-14 ENCOUNTER — Ambulatory Visit
Admission: RE | Admit: 2012-12-14 | Discharge: 2012-12-14 | Disposition: A | Discharge status: Home / Self Care | Payer: BC Managed Care – PPO | Source: Ambulatory Visit

## 2012-12-14 DIAGNOSIS — Z1231 Encounter for screening mammogram for malignant neoplasm of breast: Secondary | ICD-10-CM

## 2013-01-18 ENCOUNTER — Ambulatory Visit (INDEPENDENT_AMBULATORY_CARE_PROVIDER_SITE_OTHER): Payer: BC Managed Care – PPO | Admitting: Gynecology

## 2013-01-18 ENCOUNTER — Encounter: Payer: Self-pay | Admitting: Gynecology

## 2013-01-18 DIAGNOSIS — N760 Acute vaginitis: Secondary | ICD-10-CM

## 2013-01-18 DIAGNOSIS — A5901 Trichomonal vulvovaginitis: Secondary | ICD-10-CM

## 2013-01-18 DIAGNOSIS — Z113 Encounter for screening for infections with a predominantly sexual mode of transmission: Secondary | ICD-10-CM

## 2013-01-18 DIAGNOSIS — B9689 Other specified bacterial agents as the cause of diseases classified elsewhere: Secondary | ICD-10-CM

## 2013-01-18 DIAGNOSIS — N898 Other specified noninflammatory disorders of vagina: Secondary | ICD-10-CM

## 2013-01-18 DIAGNOSIS — A499 Bacterial infection, unspecified: Secondary | ICD-10-CM

## 2013-01-18 LAB — WET PREP FOR TRICH, YEAST, CLUE

## 2013-01-18 MED ORDER — METRONIDAZOLE 500 MG PO TABS: 500.0000 mg | ORAL_TABLET | Freq: Two times a day (BID) | ORAL | Status: DC

## 2013-01-18 NOTE — Progress Notes (Signed)
Patient presents to week history of vaginal discharge and odor. Most recently has tried to have intercourse but it's uncomfortable. No urinary symptoms, fever or chills  Exam was Berenice Bouton Abdomen soft nontender without masses guarding rebound organomegaly. Pelvic external BUS vagina with generalized erythematous vaginal changes and frothy white discharge. Bimanual without masses or tenderness.  Assessment and plan: Exam, history and wet prep consistent with Bactroban stenosis and trichomoniasis. Discussed possible STD nature of trichomoniasis. We'll screen urine for GC chlamydia and recommended her partner be evaluated and treated before she has intercourse with him again. Flagyl 500 mg twice a day x7 days, alcohol avoidance reviewed.

## 2013-01-18 NOTE — Patient Instructions (Addendum)
Trichomoniasis °Trichomoniasis is an infection, caused by the Trichomonas organism, that affects both women and men. In women, the outer female genitalia and the vagina are affected. In men, the penis is mainly affected, but the prostate and other reproductive organs can also be involved. Trichomoniasis is a sexually transmitted disease (STD) and is most often passed to another person through sexual contact. The majority of people who get trichomoniasis do so from a sexual encounter and are also at risk for other STDs. °CAUSES  °· Sexual intercourse with an infected partner. °· It can be present in swimming pools or hot tubs. °SYMPTOMS  °· Abnormal gray-green frothy vaginal discharge in women. °· Vaginal itching and irritation in women. °· Itching and irritation of the area outside the vagina in women. °· Penile discharge with or without pain in males. °· Inflammation of the urethra (urethritis), causing painful urination. °· Bleeding after sexual intercourse. °RELATED COMPLICATIONS °· Pelvic inflammatory disease. °· Infection of the uterus (endometritis). °· Infertility. °· Tubal (ectopic) pregnancy. °· It can be associated with other STDs, including gonorrhea and chlamydia, hepatitis B, and HIV. °COMPLICATIONS DURING PREGNANCY °· Early (premature) delivery. °· Premature rupture of the membranes (PROM). °· Low birth weight. °DIAGNOSIS  °· Visualization of Trichomonas under the microscope from the vagina discharge. °· Ph of the vagina greater than 4.5, tested with a test tape. °· Trich Rapid Test. °· Culture of the organism, but this is not usually needed. °· It may be found on a Pap test. °· Having a "strawberry cervix,"which means the cervix looks very red like a strawberry. °TREATMENT  °· You may be given medication to fight the infection. Inform your caregiver if you could be or are pregnant. Some medications used to treat the infection should not be taken during pregnancy. °· Over-the-counter medications or  creams to decrease itching or irritation may be recommended. °· Your sexual partner will need to be treated if infected. °HOME CARE INSTRUCTIONS  °· Take all medication prescribed by your caregiver. °· Take over-the-counter medication for itching or irritation as directed by your caregiver. °· Do not have sexual intercourse while you have the infection. °· Do not douche or wear tampons. °· Discuss your infection with your partner, as your partner may have acquired the infection from you. Or, your partner may have been the person who transmitted the infection to you. °· Have your sex partner examined and treated if necessary. °· Practice safe, informed, and protected sex. °· See your caregiver for other STD testing. °SEEK MEDICAL CARE IF:  °· You still have symptoms after you finish the medication. °· You have an oral temperature above 102° F (38.9° C). °· You develop belly (abdominal) pain. °· You have pain when you urinate. °· You have bleeding after sexual intercourse. °· You develop a rash. °· The medication makes you sick or makes you throw up (vomit). °Document Released: 10/26/2000 Document Revised: 07/25/2011 Document Reviewed: 11/21/2008 °Exit[employer]® Patient Information ©2014 [employer]. ° °

## 2013-01-19 LAB — GC/CHLAMYDIA PROBE AMP, URINE: GC Probe Amp, Urine: NEGATIVE

## 2013-06-04 ENCOUNTER — Encounter: Payer: Self-pay | Admitting: Gynecology

## 2013-06-04 ENCOUNTER — Ambulatory Visit (INDEPENDENT_AMBULATORY_CARE_PROVIDER_SITE_OTHER): Payer: BC Managed Care – PPO | Admitting: Gynecology

## 2013-06-04 VITALS — BP 114/72 | Ht 65.0 in | Wt 185.0 lb

## 2013-06-04 DIAGNOSIS — N898 Other specified noninflammatory disorders of vagina: Secondary | ICD-10-CM

## 2013-06-04 DIAGNOSIS — Z01419 Encounter for gynecological examination (general) (routine) without abnormal findings: Secondary | ICD-10-CM

## 2013-06-04 LAB — WET PREP FOR TRICH, YEAST, CLUE
Trich, Wet Prep: NONE SEEN
Yeast Wet Prep HPF POC: NONE SEEN

## 2013-06-04 NOTE — Patient Instructions (Signed)
Follow up in one year for annual exam 

## 2013-06-04 NOTE — Progress Notes (Signed)
Jillian [name] 12/08/1965 [id number]        48 y.o.  G1P0010 for annual exam.  Doing well without complaints.  Past medical history,surgical history, problem list, medications, allergies, family history and social history were all reviewed and documented in the EPIC chart.  ROS:  Performed and pertinent positives and negatives are included in the history, assessment and plan .  Exam: Kim assistant Filed Vitals:   06/04/13 0857  BP: 114/72  Height: 5\' 5"  (1.651 m)  Weight: 185 lb (83.915 kg)   General appearance  Normal Skin grossly normal Head/Neck normal with no cervical or supraclavicular adenopathy thyroid normal Lungs  clear Cardiac RR, without RMG Abdominal  soft, nontender, without masses, organomegaly or hernia Breasts  examined lying and sitting without masses, retractions, discharge or axillary adenopathy. Pelvic  Ext/BUS/vagina  Normal with normal appearing discharge.   Adnexa  Without masses or tenderness    Anus and perineum  Normal   Rectovaginal  Normal sphincter tone without palpated masses or tenderness.    Assessment/Plan:  48 y.o. 301P0010 female for annual exam.   1. Status post TVH BSO for leiomyoma. Not on HRT doing well without significant hot flashes, night sweats. Had discussed and tentatively planned on Vagifem due to vaginal dryness last year but she never started it and said that this has resolved and she is not having any issues. No dyspareunia. We will plan expectant management. 2. History of trichomonas 01/2013. Negative GC/chlamydia screen. No complaints of discharge or odor. Screening wet prep done today which was negative. Did show few clue cells but given the lack of symptoms will not treat. Followup when necessary symptoms. 3. Pap smear 2012. No Pap smear done today. No history of abnormal Pap smears previously. Status post hysterectomy for benign indications. Reviewed current screening guidelines. Options to stop screening altogether versus less  frequent screening intervals reviewed. Will readdress on an annual basis. 4. Mammography 12/2012. Continue with annual mammography. SBE monthly reviewed. 5. DEXA 2011 normal. Plan repeat at 5 year interval. Increase calcium and vitamin D recommendations reviewed. 6. Health maintenance. No blood work done as patient reports this all done through her primary physician's office. Followup one year, sooner as needed.   Note: This document was prepared with digital dictation and possible smart phrase technology. Any transcriptional errors that result from this process are unintentional.   Dara LordsFONTAINE,TIMOTHY P MD, 9:30 AM 06/04/2013

## 2013-06-05 LAB — URINALYSIS W MICROSCOPIC + REFLEX CULTURE
BACTERIA UA: NONE SEEN
BILIRUBIN URINE: NEGATIVE
Casts: NONE SEEN
GLUCOSE, UA: NEGATIVE mg/dL
Hgb urine dipstick: NEGATIVE
KETONES UR: NEGATIVE mg/dL
Leukocytes, UA: NEGATIVE
Nitrite: NEGATIVE
Protein, ur: NEGATIVE mg/dL
SPECIFIC GRAVITY, URINE: 1.027 (ref 1.005–1.030)
UROBILINOGEN UA: 0.2 mg/dL (ref 0.0–1.0)
pH: 5.5 (ref 5.0–8.0)

## 2014-01-03 ENCOUNTER — Emergency Department (HOSPITAL_COMMUNITY)
Admission: EM | Admit: 2014-01-03 | Discharge: 2014-01-03 | Disposition: A | Discharge status: Home / Self Care | Payer: BC Managed Care – PPO | Attending: Emergency Medicine | Admitting: Emergency Medicine

## 2014-01-03 ENCOUNTER — Encounter (HOSPITAL_COMMUNITY): Payer: Self-pay | Admitting: Emergency Medicine

## 2014-01-03 DIAGNOSIS — Z87891 Personal history of nicotine dependence: Secondary | ICD-10-CM | POA: Diagnosis not present

## 2014-01-03 DIAGNOSIS — I10 Essential (primary) hypertension: Secondary | ICD-10-CM | POA: Insufficient documentation

## 2014-01-03 DIAGNOSIS — IMO0002 Reserved for concepts with insufficient information to code with codable children: Secondary | ICD-10-CM | POA: Diagnosis present

## 2014-01-03 DIAGNOSIS — Y9389 Activity, other specified: Secondary | ICD-10-CM | POA: Insufficient documentation

## 2014-01-03 DIAGNOSIS — S8010XA Contusion of unspecified lower leg, initial encounter: Secondary | ICD-10-CM | POA: Insufficient documentation

## 2014-01-03 DIAGNOSIS — H409 Unspecified glaucoma: Secondary | ICD-10-CM | POA: Insufficient documentation

## 2014-01-03 DIAGNOSIS — Y92410 Unspecified street and highway as the place of occurrence of the external cause: Secondary | ICD-10-CM | POA: Insufficient documentation

## 2014-01-03 DIAGNOSIS — S8012XA Contusion of left lower leg, initial encounter: Secondary | ICD-10-CM

## 2014-01-03 DIAGNOSIS — Z79899 Other long term (current) drug therapy: Secondary | ICD-10-CM | POA: Insufficient documentation

## 2014-01-03 DIAGNOSIS — S0993XA Unspecified injury of face, initial encounter: Secondary | ICD-10-CM | POA: Diagnosis not present

## 2014-01-03 DIAGNOSIS — M545 Low back pain, unspecified: Secondary | ICD-10-CM

## 2014-01-03 DIAGNOSIS — S199XXA Unspecified injury of neck, initial encounter: Secondary | ICD-10-CM

## 2014-01-03 DIAGNOSIS — Z8659 Personal history of other mental and behavioral disorders: Secondary | ICD-10-CM | POA: Diagnosis not present

## 2014-01-03 DIAGNOSIS — M542 Cervicalgia: Secondary | ICD-10-CM

## 2014-01-03 MED ORDER — HYDROCODONE-ACETAMINOPHEN 5-325 MG PO TABS: 1.0000 | ORAL_TABLET | Freq: Four times a day (QID) | ORAL | Status: DC | PRN

## 2014-01-03 MED ORDER — CYCLOBENZAPRINE HCL 10 MG PO TABS
10.0000 mg | ORAL_TABLET | Freq: Once | ORAL | Status: AC
Start: 2014-01-03 — End: 2014-01-03
  Administered 2014-01-03: 10 mg via ORAL
  Filled 2014-01-03: qty 1

## 2014-01-03 MED ORDER — NAPROXEN 500 MG PO TABS: 500.0000 mg | ORAL_TABLET | Freq: Two times a day (BID) | ORAL | Status: DC

## 2014-01-03 MED ORDER — OXYCODONE-ACETAMINOPHEN 5-325 MG PO TABS
1.0000 | ORAL_TABLET | Freq: Once | ORAL | Status: AC
  Administered 2014-01-03: 1 via ORAL
  Filled 2014-01-03: qty 1

## 2014-01-03 MED ORDER — IBUPROFEN 400 MG PO TABS
600.0000 mg | ORAL_TABLET | Freq: Once | ORAL | Status: AC
  Administered 2014-01-03: 600 mg via ORAL
  Filled 2014-01-03 (×2): qty 1

## 2014-01-03 MED ORDER — CYCLOBENZAPRINE HCL 10 MG PO TABS: 10.0000 mg | ORAL_TABLET | Freq: Two times a day (BID) | ORAL | Status: DC | PRN
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
## 2014-01-03 NOTE — ED Provider Notes (Signed)
Medical screening examination/treatment/procedure(s) were performed by non-physician practitioner and as supervising physician I was immediately available for consultation/collaboration.   EKG Interpretation None        Yanixan Mellinger, MD 01/03/14 2340 
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
## 2014-03-05 ENCOUNTER — Other Ambulatory Visit: Payer: Self-pay

## 2014-03-05 DIAGNOSIS — Z1231 Encounter for screening mammogram for malignant neoplasm of breast: Secondary | ICD-10-CM

## 2014-03-17 ENCOUNTER — Encounter (HOSPITAL_COMMUNITY): Payer: Self-pay | Admitting: Emergency Medicine

## 2014-03-21 ENCOUNTER — Ambulatory Visit
Admission: RE | Admit: 2014-03-21 | Discharge: 2014-03-21 | Disposition: A | Discharge status: Home / Self Care | Payer: BC Managed Care – PPO | Source: Ambulatory Visit

## 2014-03-21 DIAGNOSIS — Z1231 Encounter for screening mammogram for malignant neoplasm of breast: Secondary | ICD-10-CM

## 2014-07-09 ENCOUNTER — Encounter: Payer: Self-pay | Admitting: Gynecology

## 2014-07-09 ENCOUNTER — Other Ambulatory Visit (HOSPITAL_COMMUNITY)
Admission: RE | Admit: 2014-07-09 | Discharge: 2014-07-09 | Disposition: A | Discharge status: Home / Self Care | Payer: 59 | Source: Ambulatory Visit | Attending: Gynecology | Admitting: Gynecology

## 2014-07-09 ENCOUNTER — Ambulatory Visit (INDEPENDENT_AMBULATORY_CARE_PROVIDER_SITE_OTHER): Payer: 59 | Admitting: Gynecology

## 2014-07-09 VITALS — BP 124/80 | Ht 65.0 in | Wt 183.0 lb

## 2014-07-09 DIAGNOSIS — Z01419 Encounter for gynecological examination (general) (routine) without abnormal findings: Secondary | ICD-10-CM | POA: Diagnosis present

## 2014-07-09 DIAGNOSIS — N898 Other specified noninflammatory disorders of vagina: Secondary | ICD-10-CM

## 2014-07-09 LAB — WET PREP FOR TRICH, YEAST, CLUE
TRICH WET PREP: NONE SEEN
Yeast Wet Prep HPF POC: NONE SEEN

## 2014-07-09 MED ORDER — METRONIDAZOLE 500 MG PO TABS: 500.0000 mg | ORAL_TABLET | Freq: Two times a day (BID) | ORAL | Status: DC

## 2014-07-09 NOTE — Progress Notes (Signed)
Jillian [name] 10/24/1965 [id number]        49 y.o.  G1P0010 for annual exam.  Doing well without complaints.  Past medical history,surgical history, problem list, medications, allergies, family history and social history were all reviewed and documented as reviewed in the EPIC chart.  ROS:  Performed with pertinent positives and negatives included in the history, assessment and plan.   Additional significant findings :  none   Exam: [facility] personassistant Filed Vitals:   07/09/14 0951  BP: 124/80  Height: 5\' 5"  (1.651 m)  Weight: 183 lb (83.008 kg)   General appearance:  Normal affect, orientation and appearance. Skin: Grossly normal HEENT: Without gross lesions.  No cervical or supraclavicular adenopathy. Thyroid normal.  Lungs:  Clear without wheezing, rales or rhonchi Cardiac: RR, without RMG Abdominal:  Soft, nontender, without masses, guarding, rebound, organomegaly or hernia Breasts:  Examined lying and sitting without masses, retractions, discharge or axillary adenopathy. Pelvic:  Ext/BUS/vagina with slight discharge. Wet prep done. Pap smear of vaginal cuff done  Adnexa  Without masses or tenderness    Anus and perineum  Normal   Rectovaginal  Normal sphincter tone without palpated masses or tenderness.    Assessment/Plan:  49 y.o. 241P0010 female for annual exam.   1. Status post TVH BSO for leiomyoma. Doing well not on HRT. No significant hot flushes, night sweats, vaginal dryness. Continue to monitor. Report any issues. 2. Slight vaginal discharge. History of Trichomonas in the past.  Wet prep today does show bacterial vaginosis. Will treat with Flagyl 500 mg twice a day 7 days, alcohol avoidance reviewed. 3. Pap smear 2012. Pap smear done today. No history of significant abnormal Pap smears. Options to stop screening altogether she is status post hysterectomy for benign indications versus less frequent screening intervals reviewed. Will readdress on annual basis. 4. DEXA  2011 normal. Plan repeat further into the menopause. Increased calcium vitamin D reviewed. 5. Mammography 03/2014. Continue with annual mammography. SBE monthly reviewed. 6. Colonoscopy 2011. Patient reports recommended interval 5 years and she is going to call to schedule her colonoscopy. 7. Health maintenance. No routine blood work done as she reports is done at her primary physician's office. Follow up in one year, sooner as needed.     Dara LordsFONTAINE,Ming Mcmannis P MD, 10:15 AM 07/09/2014

## 2014-07-09 NOTE — Patient Instructions (Addendum)
Take the metronidazole medication twice daily for 7 days for the vaginal discharge. Avoid alcohol while taking.  You may obtain a copy of any labs that were done today by logging onto MyChart as outlined in the instructions provided with your AVS (after visit summary). The office will not call with normal lab results but certainly if there are any significant abnormalities then we will contact you.   Health Maintenance, Female A healthy lifestyle and preventative care can promote health and wellness.  Maintain regular health, dental, and eye exams.  Eat a healthy diet. Foods like vegetables, fruits, whole grains, low-fat dairy products, and lean protein foods contain the nutrients you need without too many calories. Decrease your intake of foods high in solid fats, added sugars, and salt. Get information about a proper diet from your caregiver, if necessary.  Regular physical exercise is one of the most important things you can do for your health. Most adults should get at least 150 minutes of moderate-intensity exercise (any activity that increases your heart rate and causes you to sweat) each week. In addition, most adults need muscle-strengthening exercises on 2 or more days a week.   Maintain a healthy weight. The body mass index (BMI) is a screening tool to identify possible weight problems. It provides an estimate of body fat based on height and weight. Your caregiver can help determine your BMI, and can help you achieve or maintain a healthy weight. For adults 20 years and older:  A BMI below 18.5 is considered underweight.  A BMI of 18.5 to 24.9 is normal.  A BMI of 25 to 29.9 is considered overweight.  A BMI of 30 and above is considered obese.  Maintain normal blood lipids and cholesterol by exercising and minimizing your intake of saturated fat. Eat a balanced diet with plenty of fruits and vegetables. Blood tests for lipids and cholesterol should begin at age 9 and be repeated  every 5 years. If your lipid or cholesterol levels are high, you are over 50, or you are a high risk for heart disease, you may need your cholesterol levels checked more frequently.Ongoing high lipid and cholesterol levels should be treated with medicines if diet and exercise are not effective.  If you smoke, find out from your caregiver how to quit. If you do not use tobacco, do not start.  Lung cancer screening is recommended for adults aged 78 80 years who are at high risk for developing lung cancer because of a history of smoking. Yearly low-dose computed tomography (CT) is recommended for people who have at least a 30-pack-year history of smoking and are a current smoker or have quit within the past 15 years. A pack year of smoking is smoking an average of 1 pack of cigarettes a day for 1 year (for example: 1 pack a day for 30 years or 2 packs a day for 15 years). Yearly screening should continue until the smoker has stopped smoking for at least 15 years. Yearly screening should also be stopped for people who develop a health problem that would prevent them from having lung cancer treatment.  If you are pregnant, do not drink alcohol. If you are breastfeeding, be very cautious about drinking alcohol. If you are not pregnant and choose to drink alcohol, do not exceed 1 drink per day. One drink is considered to be 12 ounces (355 mL) of beer, 5 ounces (148 mL) of wine, or 1.5 ounces (44 mL) of liquor.  Avoid use of street  drugs. Do not share needles with anyone. Ask for help if you need support or instructions about stopping the use of drugs.  High blood pressure causes heart disease and increases the risk of stroke. Blood pressure should be checked at least every 1 to 2 years. Ongoing high blood pressure should be treated with medicines, if weight loss and exercise are not effective.  If you are 13 to 51 years old, ask your caregiver if you should take aspirin to prevent strokes.  Diabetes  screening involves taking a blood sample to check your fasting blood sugar level. This should be done once every 3 years, after age 79, if you are within normal weight and without risk factors for diabetes. Testing should be considered at a younger age or be carried out more frequently if you are overweight and have at least 1 risk factor for diabetes.  Breast cancer screening is essential preventative care for women. You should practice "breast self-awareness." This means understanding the normal appearance and feel of your breasts and may include breast self-examination. Any changes detected, no matter how small, should be reported to a caregiver. Women in their 36s and 30s should have a clinical breast exam (CBE) by a caregiver as part of a regular health exam every 1 to 3 years. After age 71, women should have a CBE every year. Starting at age 33, women should consider having a mammogram (breast X-ray) every year. Women who have a family history of breast cancer should talk to their caregiver about genetic screening. Women at a high risk of breast cancer should talk to their caregiver about having an MRI and a mammogram every year.  Breast cancer gene (BRCA)-related cancer risk assessment is recommended for women who have family members with BRCA-related cancers. BRCA-related cancers include breast, ovarian, tubal, and peritoneal cancers. Having family members with these cancers may be associated with an increased risk for harmful changes (mutations) in the breast cancer genes BRCA1 and BRCA2. Results of the assessment will determine the need for genetic counseling and BRCA1 and BRCA2 testing.  The Pap test is a screening test for cervical cancer. Women should have a Pap test starting at age 48. Between ages 82 and 31, Pap tests should be repeated every 2 years. Beginning at age 70, you should have a Pap test every 3 years as long as the past 3 Pap tests have been normal. If you had a hysterectomy for a  problem that was not cancer or a condition that could lead to cancer, then you no longer need Pap tests. If you are between ages 60 and 55, and you have had normal Pap tests going back 10 years, you no longer need Pap tests. If you have had past treatment for cervical cancer or a condition that could lead to cancer, you need Pap tests and screening for cancer for at least 20 years after your treatment. If Pap tests have been discontinued, risk factors (such as a new sexual partner) need to be reassessed to determine if screening should be resumed. Some women have medical problems that increase the chance of getting cervical cancer. In these cases, your caregiver may recommend more frequent screening and Pap tests.  The human papillomavirus (HPV) test is an additional test that may be used for cervical cancer screening. The HPV test looks for the virus that can cause the cell changes on the cervix. The cells collected during the Pap test can be tested for HPV. The HPV test could be  used to screen women aged 34 years and older, and should be used in women of any age who have unclear Pap test results. After the age of 41, women should have HPV testing at the same frequency as a Pap test.  Colorectal cancer can be detected and often prevented. Most routine colorectal cancer screening begins at the age of 2 and continues through age 67. However, your caregiver may recommend screening at an earlier age if you have risk factors for colon cancer. On a yearly basis, your caregiver may provide home test kits to check for hidden blood in the stool. Use of a small camera at the end of a tube, to directly examine the colon (sigmoidoscopy or colonoscopy), can detect the earliest forms of colorectal cancer. Talk to your caregiver about this at age 49, when routine screening begins. Direct examination of the colon should be repeated every 5 to 10 years through age 43, unless early forms of pre-cancerous polyps or small growths  are found.  Hepatitis C blood testing is recommended for all people born from [birth year] through 1967 and any individual with known risks for hepatitis C.  Practice safe sex. Use condoms and avoid high-risk sexual practices to reduce the spread of sexually transmitted infections (STIs). Sexually active women aged 21 and younger should be checked for Chlamydia, which is a common sexually transmitted infection. Older women with new or multiple partners should also be tested for Chlamydia. Testing for other STIs is recommended if you are sexually active and at increased risk.  Osteoporosis is a disease in which the bones lose minerals and strength with aging. This can result in serious bone fractures. The risk of osteoporosis can be identified using a bone density scan. Women ages 44 and over and women at risk for fractures or osteoporosis should discuss screening with their caregivers. Ask your caregiver whether you should be taking a calcium supplement or vitamin D to reduce the rate of osteoporosis.  Menopause can be associated with physical symptoms and risks. Hormone replacement therapy is available to decrease symptoms and risks. You should talk to your caregiver about whether hormone replacement therapy is right for you.  Use sunscreen. Apply sunscreen liberally and repeatedly throughout the day. You should seek shade when your shadow is shorter than you. Protect yourself by wearing long sleeves, pants, a wide-brimmed hat, and sunglasses year round, whenever you are outdoors.  Notify your caregiver of new moles or changes in moles, especially if there is a change in shape or color. Also notify your caregiver if a mole is larger than the size of a pencil eraser.  Stay current with your immunizations. Document Released: 11/15/2010 Document Revised: 08/27/2012 Document Reviewed: 11/15/2010 [facility] Patient Information 2014 [place].

## 2014-07-09 NOTE — Addendum Note (Signed)
Addended by: Dayna BarkerGARDNER, Sibel Khurana K on: 07/09/2014 11:18 AM   Modules accepted: Orders

## 2014-07-10 LAB — URINALYSIS W MICROSCOPIC + REFLEX CULTURE
BACTERIA UA: NONE SEEN
BILIRUBIN URINE: NEGATIVE
Casts: NONE SEEN
Crystals: NONE SEEN
GLUCOSE, UA: NEGATIVE mg/dL
HGB URINE DIPSTICK: NEGATIVE
KETONES UR: NEGATIVE mg/dL
Leukocytes, UA: NEGATIVE
Nitrite: NEGATIVE
PROTEIN: NEGATIVE mg/dL
Specific Gravity, Urine: 1.012 (ref 1.005–1.030)
Squamous Epithelial / LPF: NONE SEEN
Urobilinogen, UA: 0.2 mg/dL (ref 0.0–1.0)
pH: 6 (ref 5.0–8.0)

## 2014-07-10 LAB — CYTOLOGY - PAP

## 2015-02-24 ENCOUNTER — Other Ambulatory Visit: Payer: Self-pay

## 2015-02-24 DIAGNOSIS — Z1231 Encounter for screening mammogram for malignant neoplasm of breast: Secondary | ICD-10-CM

## 2015-03-23 ENCOUNTER — Ambulatory Visit
Admission: RE | Admit: 2015-03-23 | Discharge: 2015-03-23 | Disposition: A | Discharge status: Home / Self Care | Payer: BLUE CROSS/BLUE SHIELD | Source: Ambulatory Visit

## 2015-03-23 DIAGNOSIS — Z1231 Encounter for screening mammogram for malignant neoplasm of breast: Secondary | ICD-10-CM

## 2015-07-16 ENCOUNTER — Ambulatory Visit (INDEPENDENT_AMBULATORY_CARE_PROVIDER_SITE_OTHER): Payer: BLUE CROSS/BLUE SHIELD | Admitting: Physician Assistant

## 2015-07-16 VITALS — BP 118/70 | HR 67 | Temp 99.2°F | Resp 18 | Wt 178.0 lb

## 2015-07-16 DIAGNOSIS — R509 Fever, unspecified: Secondary | ICD-10-CM | POA: Diagnosis not present

## 2015-07-16 DIAGNOSIS — R5383 Other fatigue: Secondary | ICD-10-CM | POA: Diagnosis not present

## 2015-07-16 LAB — POCT URINALYSIS DIP (MANUAL ENTRY)
Bilirubin, UA: NEGATIVE
Glucose, UA: NEGATIVE
Ketones, POC UA: NEGATIVE
Nitrite, UA: NEGATIVE
Protein Ur, POC: NEGATIVE
Spec Grav, UA: 1.01
Urobilinogen, UA: 0.2
pH, UA: 6

## 2015-07-16 LAB — POCT CBC
Granulocyte percent: 53.7 % (ref 37–80)
HCT, POC: 39.5 % (ref 37.7–47.9)
Hemoglobin: 13.6 g/dL (ref 12.2–16.2)
Lymph, poc: 1.6 (ref 0.6–3.4)
MCH, POC: 28.8 pg (ref 27–31.2)
MCHC: 34.5 g/dL (ref 31.8–35.4)
MCV: 83.4 fL (ref 80–97)
MID (cbc): 0.5 (ref 0–0.9)
MPV: 8.3 fL (ref 0–99.8)
POC Granulocyte: 2.4 (ref 2–6.9)
POC LYMPH PERCENT: 35.8 % (ref 10–50)
POC MID %: 10.5 % (ref 0–12)
Platelet Count, POC: 151 10*3/uL (ref 142–424)
RBC: 4.73 M/uL (ref 4.04–5.48)
RDW, POC: 12.6 %
WBC: 4.4 10*3/uL — AB (ref 4.6–10.2)

## 2015-07-16 LAB — POC MICROSCOPIC URINALYSIS (UMFC): Mucus: ABSENT

## 2015-07-16 LAB — POCT INFLUENZA A/B
Influenza A, POC: NEGATIVE
Influenza B, POC: NEGATIVE

## 2015-07-16 MED ORDER — OSELTAMIVIR PHOSPHATE 75 MG PO CAPS: 75.0000 mg | ORAL_CAPSULE | Freq: Two times a day (BID) | ORAL | Status: DC

## 2015-07-16 MED ORDER — NAPROXEN 500 MG PO TABS: 500.0000 mg | ORAL_TABLET | Freq: Two times a day (BID) | ORAL | Status: DC

## 2015-07-16 MED ORDER — ACETAMINOPHEN 160 MG/5ML PO SOLN
1000.0000 mg | Freq: Once | ORAL | Status: AC
Start: 2015-07-16 — End: 2015-07-16
  Administered 2015-07-16: 1000 mg via ORAL

## 2015-07-16 MED ORDER — HYDROCODONE-HOMATROPINE 5-1.5 MG/5ML PO SYRP: 2.5000 mL | ORAL_SOLUTION | Freq: Every evening | ORAL | Status: DC | PRN

## 2015-07-16 NOTE — Progress Notes (Signed)
dx  07/16/2015 2:54 PM   DOB: 10/03/1965 / MRN: [id number]  SUBJECTIVE:  Jillian [name] is a 50 y.o. female presenting for fatigue and chills that started 2 days ago.  She reports sick contacts and has not had the seasonal flu shot.  She has no history of asthma and diabetes.  She feels that this problem is getting worse.     There is no immunization history on file for this patient.    She is allergic to adhesive.   She  has a past medical history of Hypertension and Glaucoma.    She  reports that she quit smoking about 24 years ago. She has never used smokeless tobacco. She reports that she drinks alcohol. She reports that she does not use illicit drugs. She  reports that she does not currently engage in sexual activity. She reports using the following methods of birth control/protection: Other-see comments and Surgical. The patient  has past surgical history that includes Abdominal hysterectomy (2009); MULTIPLE MYOMECTOMY (2001); Pelvic laparoscopy; Heel spur surgery (OCT 2009); Eye surgery (05/2010); and Oophorectomy.  Her family history includes Diabetes in her maternal uncle; Heart disease in her mother; Hypertension in her mother.  Review of Systems  Constitutional: Positive for malaise/fatigue. Negative for fever, chills and diaphoresis.  HENT: Positive for congestion and sore throat.   Respiratory: Positive for cough. Negative for hemoptysis, shortness of breath and wheezing.   Cardiovascular: Negative for chest pain.  Gastrointestinal: Negative for nausea.  Skin: Negative for rash.  Neurological: Negative for dizziness and weakness.  Endo/Heme/Allergies: Negative for polydipsia.    Problem list and medications reviewed and updated by myself where necessary, and exist elsewhere in the encounter.   OBJECTIVE:  BP 118/70 mmHg  Pulse 67  Temp(Src) 99.2 F (37.3 C) (Oral)  Resp 18  Wt 178 lb (80.74 kg)  SpO2 98%  LMP 09/14/2007  Physical Exam  Cardiovascular: Normal  rate, regular rhythm and normal heart sounds.   Pulmonary/Chest: Effort normal and breath sounds normal. No respiratory distress. She has no wheezes. She has no rales. She exhibits no tenderness.  Abdominal: Soft. Bowel sounds are normal. She exhibits no distension and no mass. There is no tenderness. There is no rebound and no guarding.    Results for orders placed or performed in visit on 07/16/15 (from the past 72 hour(s))  POCT urinalysis dipstick     Status: Abnormal   Collection Time: 07/16/15  1:40 PM  Result Value Ref Range   Color, UA yellow yellow   Clarity, UA clear clear   Glucose, UA negative negative   Bilirubin, UA negative negative   Ketones, POC UA negative negative   Spec Grav, UA 1.010    Blood, UA moderate (A) negative   pH, UA 6.0    Protein Ur, POC negative negative   Urobilinogen, UA 0.2    Nitrite, UA Negative Negative   Leukocytes, UA small (1+) (A) Negative  POCT Microscopic Urinalysis ([facility])     Status: Abnormal   Collection Time: 07/16/15  1:40 PM  Result Value Ref Range   WBC,UR,HPF,POC Few (A) None WBC/hpf   RBC,UR,HPF,POC None None RBC/hpf   Bacteria Few (A) None, Too numerous to count   Mucus Absent Absent   Epithelial Cells, UR Per Microscopy Few (A) None, Too numerous to count cells/hpf  POCT Influenza A/B     Status: None   Collection Time: 07/16/15  2:26 PM  Result Value Ref Range   Influenza A, POC  Negative Negative   Influenza B, POC Negative Negative  POCT CBC     Status: Abnormal   Collection Time: 07/16/15  2:47 PM  Result Value Ref Range   WBC 4.4 (A) 4.6 - 10.2 K/uL   Lymph, poc 1.6 0.6 - 3.4   POC LYMPH PERCENT 35.8 10 - 50 %L   MID (cbc) 0.5 0 - 0.9   POC MID % 10.5 0 - 12 %M   POC Granulocyte 2.4 2 - 6.9   Granulocyte percent 53.7 37 - 80 %G   RBC 4.73 4.04 - 5.48 M/uL   Hemoglobin 13.6 12.2 - 16.2 g/dL   HCT, POC 16.1 09.6 - 47.9 %   MCV 83.4 80 - 97 fL   MCH, POC 28.8 27 - 31.2 pg   MCHC 34.5 31.8 - 35.4 g/dL   RDW, POC  04.5 %   Platelet Count, POC 151 142 - 424 K/uL   MPV 8.3 0 - 99.8 fL    No results found.  ASSESSMENT AND PLAN  Menucha was seen today for chills, diarrhea and fatigue.  Diagnoses and all orders for this visit:  Other fatigue: Her symptoms are consistent with the flu.  Will treat for that.  Advised she control her fever with tylenol and drink plenty of fluids.  RTC if worse with this plan, or prn if improving.   -     POCT urinalysis dipstick -     POCT Microscopic Urinalysis ([facility]) -     POCT Influenza A/B -     acetaminophen (TYLENOL) solution 1,000 mg; Take 31.3 mLs (1,000 mg total) by mouth once.  Low grade fever -     acetaminophen (TYLENOL) solution 1,000 mg; Take 31.3 mLs (1,000 mg total) by mouth once. -     POCT CBC    The patient was advised to call or return to clinic if she does not see an improvement in symptoms or to seek the care of the closest emergency department if she worsens with the above plan.   Deliah Boston, MHS, PA-C [facility] 07/16/2015 2:54 PM

## 2015-08-25 DIAGNOSIS — H401121 Primary open-angle glaucoma, left eye, mild stage: Secondary | ICD-10-CM | POA: Diagnosis not present

## 2015-08-25 DIAGNOSIS — H401113 Primary open-angle glaucoma, right eye, severe stage: Secondary | ICD-10-CM | POA: Diagnosis not present

## 2015-12-10 ENCOUNTER — Ambulatory Visit (INDEPENDENT_AMBULATORY_CARE_PROVIDER_SITE_OTHER): Payer: BLUE CROSS/BLUE SHIELD | Admitting: Gynecology

## 2015-12-10 ENCOUNTER — Encounter: Payer: BLUE CROSS/BLUE SHIELD | Admitting: Gynecology

## 2015-12-10 ENCOUNTER — Encounter: Payer: Self-pay | Admitting: Gynecology

## 2015-12-10 VITALS — BP 124/80 | Ht 65.0 in | Wt 182.0 lb

## 2015-12-10 DIAGNOSIS — Z01419 Encounter for gynecological examination (general) (routine) without abnormal findings: Secondary | ICD-10-CM | POA: Diagnosis not present

## 2015-12-10 DIAGNOSIS — N951 Menopausal and female climacteric states: Secondary | ICD-10-CM

## 2015-12-10 NOTE — Patient Instructions (Signed)
Call to see when you are due to repeat your colonoscopy.  Schedule your mammogram end of this year when due.  You may obtain a copy of any labs that were done today by logging onto MyChart as outlined in the instructions provided with your AVS (after visit summary). The office will not call with normal lab results but certainly if there are any significant abnormalities then we will contact you.   Health Maintenance Adopting a healthy lifestyle and getting preventive care can go a long way to promote health and wellness. Talk with your health care provider about what schedule of regular examinations is right for you. This is a good chance for you to check in with your provider about disease prevention and staying healthy. In between checkups, there are plenty of things you can do on your own. Experts have done a lot of research about which lifestyle changes and preventive measures are most likely to keep you healthy. Ask your health care provider for more information. WEIGHT AND DIET  Eat a healthy diet  Be sure to include plenty of vegetables, fruits, low-fat dairy products, and lean protein.  Do not eat a lot of foods high in solid fats, added sugars, or salt.  Get regular exercise. This is one of the most important things you can do for your health.  Most adults should exercise for at least 150 minutes each week. The exercise should increase your heart rate and make you sweat (moderate-intensity exercise).  Most adults should also do strengthening exercises at least twice a week. This is in addition to the moderate-intensity exercise.  Maintain a healthy weight  Body mass index (BMI) is a measurement that can be used to identify possible weight problems. It estimates body fat based on height and weight. Your health care provider can help determine your BMI and help you achieve or maintain a healthy weight.  For females 24 years of age and older:   A BMI below 18.5 is considered  underweight.  A BMI of 18.5 to 24.9 is normal.  A BMI of 25 to 29.9 is considered overweight.  A BMI of 30 and above is considered obese.  Watch levels of cholesterol and blood lipids  You should start having your blood tested for lipids and cholesterol at 52 years of age, then have this test every 5 years.  You may need to have your cholesterol levels checked more often if:  Your lipid or cholesterol levels are high.  You are older than 52 years of age.  You are at high risk for heart disease.  CANCER SCREENING   Lung Cancer  Lung cancer screening is recommended for adults 91-72 years old who are at high risk for lung cancer because of a history of smoking.  A yearly low-dose CT scan of the lungs is recommended for people who:  Currently smoke.  Have quit within the past 15 years.  Have at least a 30-pack-year history of smoking. A pack year is smoking an average of one pack of cigarettes a day for 1 year.  Yearly screening should continue until it has been 15 years since you quit.  Yearly screening should stop if you develop a health problem that would prevent you from having lung cancer treatment.  Breast Cancer  Practice breast self-awareness. This means understanding how your breasts normally appear and feel.  It also means doing regular breast self-exams. Let your health care provider know about any changes, no matter how small.  If  you are in your 20s or 30s, you should have a clinical breast exam (CBE) by a health care provider every 1-3 years as part of a regular health exam.  If you are 55 or older, have a CBE every year. Also consider having a breast X-ray (mammogram) every year.  If you have a family history of breast cancer, talk to your health care provider about genetic screening.  If you are at high risk for breast cancer, talk to your health care provider about having an MRI and a mammogram every year.  Breast cancer gene (BRCA) assessment is  recommended for women who have family members with BRCA-related cancers. BRCA-related cancers include:  Breast.  Ovarian.  Tubal.  Peritoneal cancers.  Results of the assessment will determine the need for genetic counseling and BRCA1 and BRCA2 testing. Cervical Cancer Routine pelvic examinations to screen for cervical cancer are no longer recommended for nonpregnant women who are considered low risk for cancer of the pelvic organs (ovaries, uterus, and vagina) and who do not have symptoms. A pelvic examination may be necessary if you have symptoms including those associated with pelvic infections. Ask your health care provider if a screening pelvic exam is right for you.   The Pap test is the screening test for cervical cancer for women who are considered at risk.  If you had a hysterectomy for a problem that was not cancer or a condition that could lead to cancer, then you no longer need Pap tests.  If you are older than 65 years, and you have had normal Pap tests for the past 10 years, you no longer need to have Pap tests.  If you have had past treatment for cervical cancer or a condition that could lead to cancer, you need Pap tests and screening for cancer for at least 20 years after your treatment.  If you no longer get a Pap test, assess your risk factors if they change (such as having a new sexual partner). This can affect whether you should start being screened again.  Some women have medical problems that increase their chance of getting cervical cancer. If this is the case for you, your health care provider may recommend more frequent screening and Pap tests.  The human papillomavirus (HPV) test is another test that may be used for cervical cancer screening. The HPV test looks for the virus that can cause cell changes in the cervix. The cells collected during the Pap test can be tested for HPV.  The HPV test can be used to screen women 74 years of age and older. Getting tested  for HPV can extend the interval between normal Pap tests from three to five years.  An HPV test also should be used to screen women of any age who have unclear Pap test results.  After 52 years of age, women should have HPV testing as often as Pap tests.  Colorectal Cancer  This type of cancer can be detected and often prevented.  Routine colorectal cancer screening usually begins at 52 years of age and continues through 52 years of age.  Your health care provider may recommend screening at an earlier age if you have risk factors for colon cancer.  Your health care provider may also recommend using home test kits to check for hidden blood in the stool.  A small camera at the end of a tube can be used to examine your colon directly (sigmoidoscopy or colonoscopy). This is done to check for the  earliest forms of colorectal cancer.  Routine screening usually begins at age 45.  Direct examination of the colon should be repeated every 5-10 years through 52 years of age. However, you may need to be screened more often if early forms of precancerous polyps or small growths are found. Skin Cancer  Check your skin from head to toe regularly.  Tell your health care provider about any new moles or changes in moles, especially if there is a change in a mole's shape or color.  Also tell your health care provider if you have a mole that is larger than the size of a pencil eraser.  Always use sunscreen. Apply sunscreen liberally and repeatedly throughout the day.  Protect yourself by wearing long sleeves, pants, a wide-brimmed hat, and sunglasses whenever you are outside. HEART DISEASE, DIABETES, AND HIGH BLOOD PRESSURE   Have your blood pressure checked at least every 1-2 years. High blood pressure causes heart disease and increases the risk of stroke.  If you are between 29 years and 56 years old, ask your health care provider if you should take aspirin to prevent strokes.  Have regular  diabetes screenings. This involves taking a blood sample to check your fasting blood sugar level.  If you are at a normal weight and have a low risk for diabetes, have this test once every three years after 52 years of age.  If you are overweight and have a high risk for diabetes, consider being tested at a younger age or more often. PREVENTING INFECTION  Hepatitis B  If you have a higher risk for hepatitis B, you should be screened for this virus. You are considered at high risk for hepatitis B if:  You were born in a country where hepatitis B is common. Ask your health care provider which countries are considered high risk.  Your parents were born in a high-risk country, and you have not been immunized against hepatitis B (hepatitis B vaccine).  You have HIV or AIDS.  You use needles to inject street drugs.  You live with someone who has hepatitis B.  You have had sex with someone who has hepatitis B.  You get hemodialysis treatment.  You take certain medicines for conditions, including cancer, organ transplantation, and autoimmune conditions. Hepatitis C  Blood testing is recommended for:  Everyone born from [birth year] through 1967.  Anyone with known risk factors for hepatitis C. Sexually transmitted infections (STIs)  You should be screened for sexually transmitted infections (STIs) including gonorrhea and chlamydia if:  You are sexually active and are younger than 52 years of age.  You are older than 52 years of age and your health care provider tells you that you are at risk for this type of infection.  Your sexual activity has changed since you were last screened and you are at an increased risk for chlamydia or gonorrhea. Ask your health care provider if you are at risk.  If you do not have HIV, but are at risk, it may be recommended that you take a prescription medicine daily to prevent HIV infection. This is called pre-exposure prophylaxis (PrEP). You are considered at  risk if:  You are sexually active and do not regularly use condoms or know the HIV status of your partner(s).  You take drugs by injection.  You are sexually active with a partner who has HIV. Talk with your health care provider about whether you are at high risk of being infected with HIV. If you choose  to begin PrEP, you should first be tested for HIV. You should then be tested every 3 months for as long as you are taking PrEP.  PREGNANCY   If you are premenopausal and you may become pregnant, ask your health care provider about preconception counseling.  If you may become pregnant, take 400 to 800 micrograms (mcg) of folic acid every day.  If you want to prevent pregnancy, talk to your health care provider about birth control (contraception). OSTEOPOROSIS AND MENOPAUSE   Osteoporosis is a disease in which the bones lose minerals and strength with aging. This can result in serious bone fractures. Your risk for osteoporosis can be identified using a bone density scan.  If you are 26 years of age or older, or if you are at risk for osteoporosis and fractures, ask your health care provider if you should be screened.  Ask your health care provider whether you should take a calcium or vitamin D supplement to lower your risk for osteoporosis.  Menopause may have certain physical symptoms and risks.  Hormone replacement therapy may reduce some of these symptoms and risks. Talk to your health care provider about whether hormone replacement therapy is right for you.  HOME CARE INSTRUCTIONS   Schedule regular health, dental, and eye exams.  Stay current with your immunizations.   Do not use any tobacco products including cigarettes, chewing tobacco, or electronic cigarettes.  If you are pregnant, do not drink alcohol.  If you are breastfeeding, limit how much and how often you drink alcohol.  Limit alcohol intake to no more than 1 drink per day for nonpregnant women. One drink equals  12 ounces of beer, 5 ounces of wine, or 1 ounces of hard liquor.  Do not use street drugs.  Do not share needles.  Ask your health care provider for help if you need support or information about quitting drugs.  Tell your health care provider if you often feel depressed.  Tell your health care provider if you have ever been abused or do not feel safe at home. Document Released: 11/15/2010 Document Revised: 09/16/2013 Document Reviewed: 04/03/2013 [facility] Patient Information 2015 [place], [place]. This information is not intended to replace advice given to you by your health care provider. Make sure you discuss any questions you have with your health care provider.

## 2015-12-10 NOTE — Progress Notes (Signed)
    Jillian [name] [birth year] [id number]        50 y.o.  G1P0010  for annual exam.  Doing well without complaints  Past medical history,surgical history, problem list, medications, allergies, family history and social history were all reviewed and documented as reviewed in the EPIC chart.  ROS:  Performed with pertinent positives and negatives included in the history, assessment and plan.   Additional significant findings :  None   Exam: Kennon Portela assistant Vitals:   12/10/15 1020  BP: 124/80  Weight: 182 lb (82.6 kg)  Height: 5\' 5"  (1.651 m)   General appearance:  Normal affect, orientation and appearance. Skin: Grossly normal HEENT: Without gross lesions.  No cervical or supraclavicular adenopathy. Thyroid normal.  Lungs:  Clear without wheezing, rales or rhonchi Cardiac: RR, without RMG Abdominal:  Soft, nontender, without masses, guarding, rebound, organomegaly or hernia Breasts:  Examined lying and sitting without masses, retractions, discharge or axillary adenopathy. Pelvic:  Ext/BUS/Vagina normal  Adnexa without masses or tenderness    Anus and perineum normal   Rectovaginal normal sphincter tone without palpated masses or tenderness.    Assessment/Plan:  50 y.o. G100P0010 female for annual exam.   1. History of TVH BSO for leiomyoma. Doing well. Having some hot flushes which are tolerable and she does not want treatment. No vaginal dryness or night sweats. Continue to monitor and call if she wants to rediscuss treatment options if they worsen. 2. Pap smear 2016. No Pap smear done today. No history of abnormal Pap smears. Options to stop screening per current screening guidelines as she is status post hysterectomy discussed. Will readdress on annual basis. 3. Mammography coming due this fall and I reminded her to schedule this. SBE monthly reviewed. 4. DEXA 2011 normal. Plan repeat further into the menopause. Increased calcium vitamin D. 5. Colonoscopy 2011. Did have  polyps before. I asked her to call her gastroenterologist's office to see when they want to repeat it and she agrees to do so. 6. Health maintenance. No routine lab work done as patient reports this done elsewhere. Follow up 1 year, sooner as needed.    Dara Lords MD, 10:53 AM 12/10/2015

## 2015-12-25 DIAGNOSIS — H401113 Primary open-angle glaucoma, right eye, severe stage: Secondary | ICD-10-CM | POA: Diagnosis not present

## 2015-12-25 DIAGNOSIS — H401121 Primary open-angle glaucoma, left eye, mild stage: Secondary | ICD-10-CM | POA: Diagnosis not present

## 2016-01-05 DIAGNOSIS — Z86010 Personal history of colonic polyps: Secondary | ICD-10-CM | POA: Diagnosis not present

## 2016-01-06 DIAGNOSIS — Z01818 Encounter for other preprocedural examination: Secondary | ICD-10-CM | POA: Diagnosis not present

## 2016-01-06 DIAGNOSIS — K635 Polyp of colon: Secondary | ICD-10-CM | POA: Diagnosis not present

## 2016-01-08 DIAGNOSIS — K635 Polyp of colon: Secondary | ICD-10-CM | POA: Diagnosis not present

## 2016-02-02 DIAGNOSIS — Z800 Family history of malignant neoplasm of digestive organs: Secondary | ICD-10-CM | POA: Diagnosis not present

## 2016-02-02 DIAGNOSIS — Z86010 Personal history of colonic polyps: Secondary | ICD-10-CM | POA: Diagnosis not present

## 2016-04-20 DIAGNOSIS — H401121 Primary open-angle glaucoma, left eye, mild stage: Secondary | ICD-10-CM | POA: Diagnosis not present

## 2016-04-20 DIAGNOSIS — H401113 Primary open-angle glaucoma, right eye, severe stage: Secondary | ICD-10-CM | POA: Diagnosis not present

## 2016-04-29 ENCOUNTER — Other Ambulatory Visit: Payer: Self-pay | Admitting: Internal Medicine

## 2016-04-29 DIAGNOSIS — Z1231 Encounter for screening mammogram for malignant neoplasm of breast: Secondary | ICD-10-CM

## 2016-05-31 ENCOUNTER — Ambulatory Visit
Admission: RE | Admit: 2016-05-31 | Discharge: 2016-05-31 | Disposition: A | Discharge status: Home / Self Care | Payer: BLUE CROSS/BLUE SHIELD | Source: Ambulatory Visit | Attending: Internal Medicine | Admitting: Internal Medicine

## 2016-05-31 DIAGNOSIS — Z1231 Encounter for screening mammogram for malignant neoplasm of breast: Secondary | ICD-10-CM | POA: Diagnosis not present

## 2016-06-16 DIAGNOSIS — Z0000 Encounter for general adult medical examination without abnormal findings: Secondary | ICD-10-CM | POA: Diagnosis not present

## 2016-08-24 DIAGNOSIS — H401113 Primary open-angle glaucoma, right eye, severe stage: Secondary | ICD-10-CM | POA: Diagnosis not present

## 2016-08-24 DIAGNOSIS — H401121 Primary open-angle glaucoma, left eye, mild stage: Secondary | ICD-10-CM | POA: Diagnosis not present

## 2016-12-12 ENCOUNTER — Ambulatory Visit (INDEPENDENT_AMBULATORY_CARE_PROVIDER_SITE_OTHER): Payer: BLUE CROSS/BLUE SHIELD | Admitting: Gynecology

## 2016-12-12 ENCOUNTER — Encounter: Payer: Self-pay | Admitting: Gynecology

## 2016-12-12 VITALS — BP 118/82 | Ht 65.0 in | Wt 186.0 lb

## 2016-12-12 DIAGNOSIS — Z01411 Encounter for gynecological examination (general) (routine) with abnormal findings: Secondary | ICD-10-CM

## 2016-12-12 DIAGNOSIS — N952 Postmenopausal atrophic vaginitis: Secondary | ICD-10-CM | POA: Diagnosis not present

## 2016-12-12 DIAGNOSIS — M545 Low back pain: Secondary | ICD-10-CM

## 2016-12-12 DIAGNOSIS — G8929 Other chronic pain: Secondary | ICD-10-CM | POA: Diagnosis not present

## 2016-12-12 LAB — URINALYSIS W MICROSCOPIC + REFLEX CULTURE
Bilirubin Urine: NEGATIVE
Casts: NONE SEEN [LPF]
Crystals: NONE SEEN [HPF]
Glucose, UA: NEGATIVE
HGB URINE DIPSTICK: NEGATIVE
Ketones, ur: NEGATIVE
Nitrite: NEGATIVE
PROTEIN: NEGATIVE
RBC / HPF: NONE SEEN RBC/HPF (ref <=2)
Specific Gravity, Urine: 1.025 (ref 1.001–1.035)
Yeast: NONE SEEN [HPF]
pH: 5.5 (ref 5.0–8.0)

## 2016-12-12 NOTE — Patient Instructions (Signed)
Try the Motrin at bedtime and the low back exercises as we discussed.  Follow up with your orthopedic physician if your low back pain continues.

## 2016-12-12 NOTE — Addendum Note (Signed)
Addended by: Kem ParkinsonBARNES, Jerett Odonohue on: 12/12/2016 11:00 AM   Modules accepted: Orders

## 2016-12-12 NOTE — Addendum Note (Signed)
Addended by: Dara LordsFONTAINE, Tequilla Cousineau P on: 12/12/2016 11:07 AM   Modules accepted: Orders
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
## 2016-12-13 LAB — PAP IG W/ RFLX HPV ASCU

## 2016-12-13 LAB — URINE CULTURE: Organism ID, Bacteria: NO GROWTH

## 2016-12-28 DIAGNOSIS — H401113 Primary open-angle glaucoma, right eye, severe stage: Secondary | ICD-10-CM | POA: Diagnosis not present

## 2016-12-28 DIAGNOSIS — H401121 Primary open-angle glaucoma, left eye, mild stage: Secondary | ICD-10-CM | POA: Diagnosis not present

## 2017-01-12 DIAGNOSIS — H40033 Anatomical narrow angle, bilateral: Secondary | ICD-10-CM | POA: Diagnosis not present

## 2017-01-12 DIAGNOSIS — H04123 Dry eye syndrome of bilateral lacrimal glands: Secondary | ICD-10-CM | POA: Diagnosis not present

## 2017-02-22 ENCOUNTER — Other Ambulatory Visit: Payer: Self-pay | Admitting: Internal Medicine

## 2017-02-22 DIAGNOSIS — Z1231 Encounter for screening mammogram for malignant neoplasm of breast: Secondary | ICD-10-CM

## 2017-02-26 IMAGING — MG 2D DIGITAL SCREENING BILATERAL MAMMOGRAM WITH CAD AND ADJUNCT TO
8 of 12 series · 8 of 28 positions shown · non-contrast
Comparison: Previous exam(s).

CLINICAL DATA: Screening.

EXAM:
2D DIGITAL SCREENING BILATERAL MAMMOGRAM WITH CAD AND ADJUNCT TOMO

[R MLO]
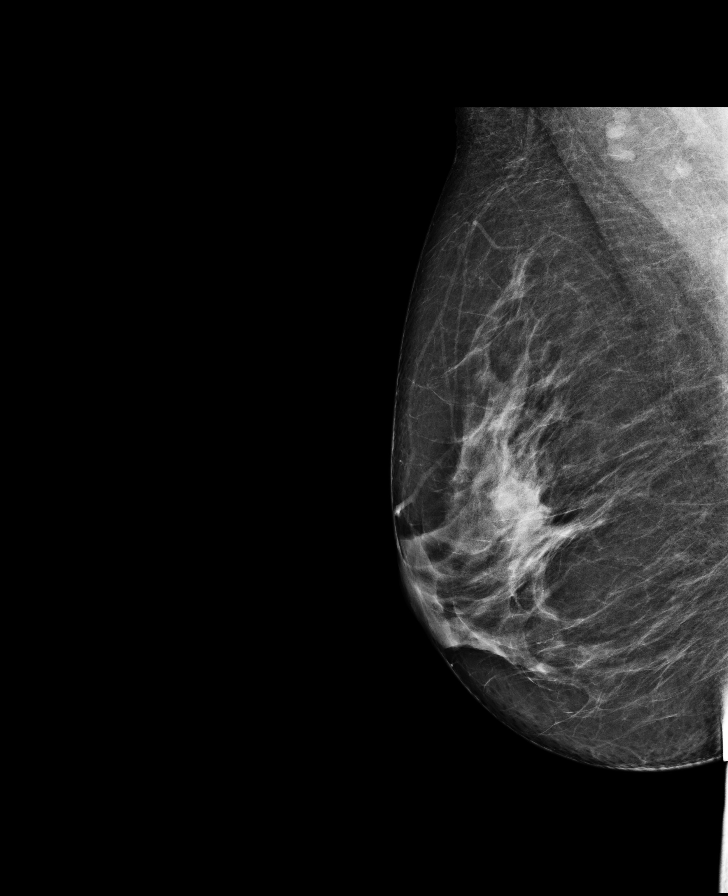

[R CC synth-2D]
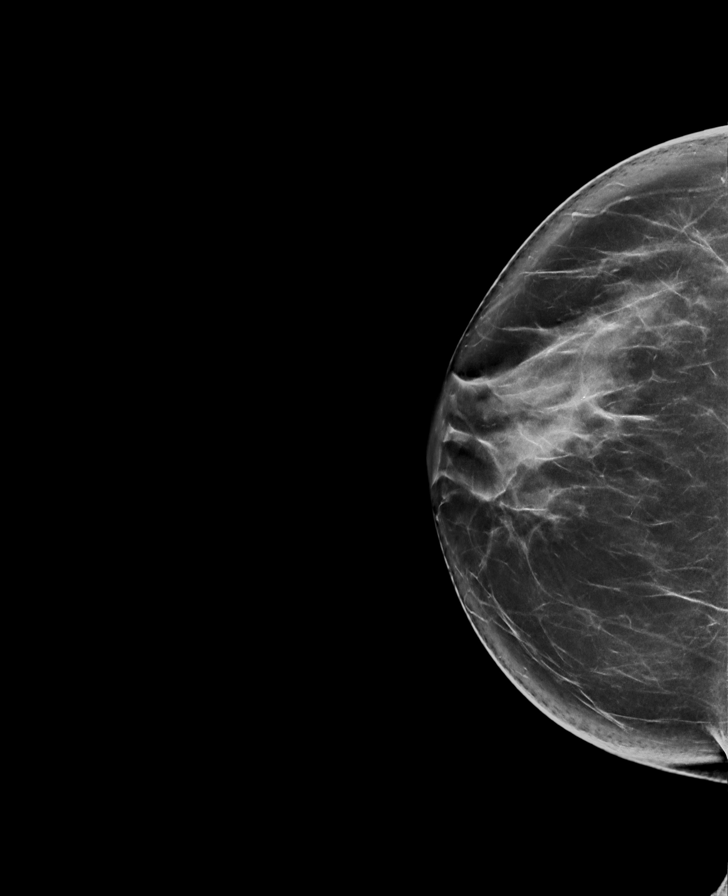

[R MLO synth-2D]
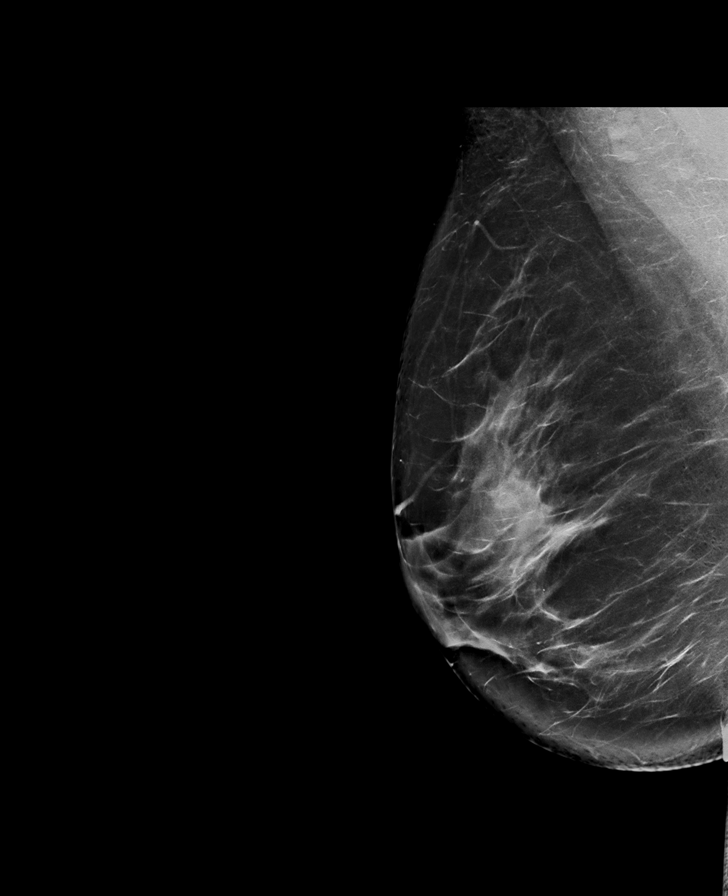

[L MLO synth-2D]
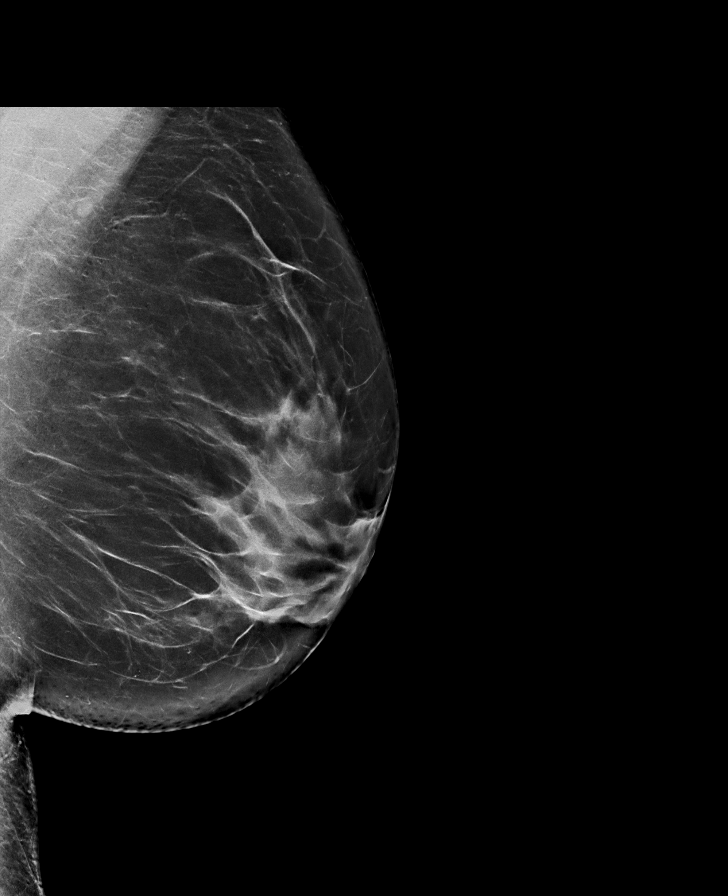

[L CC]
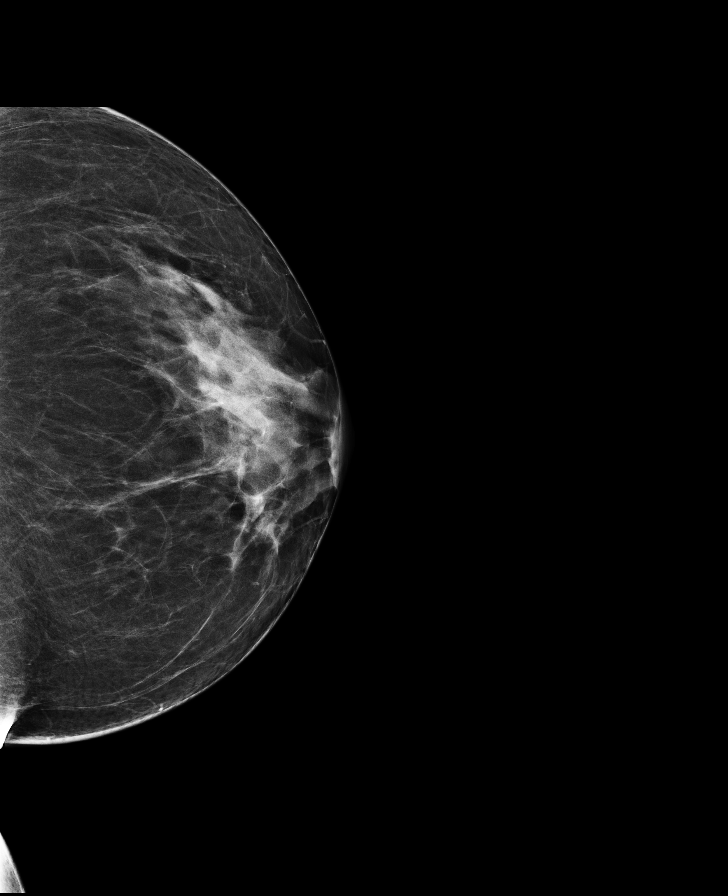

[R CC]
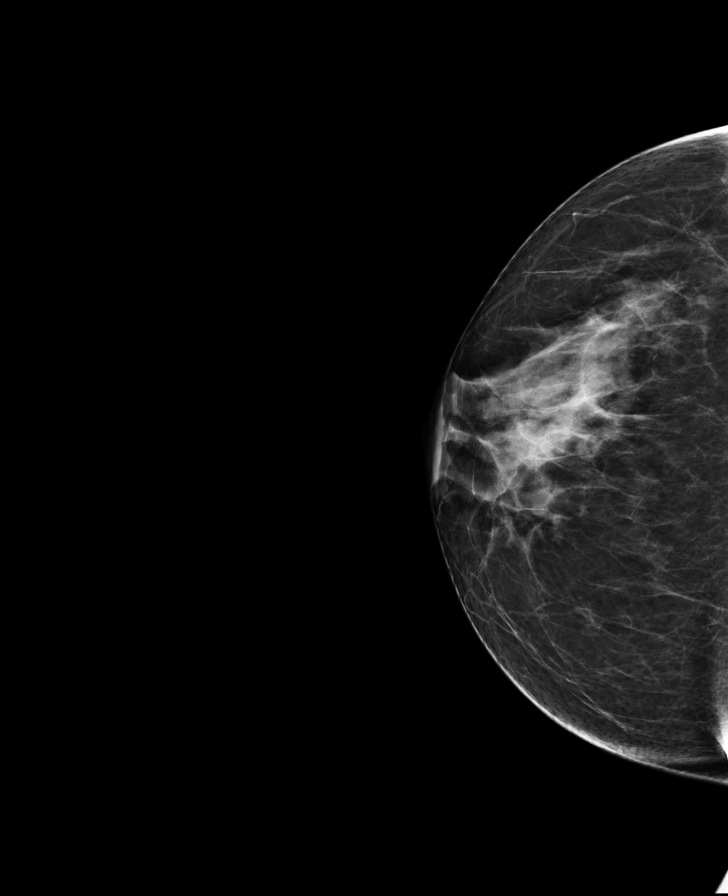

[L MLO]
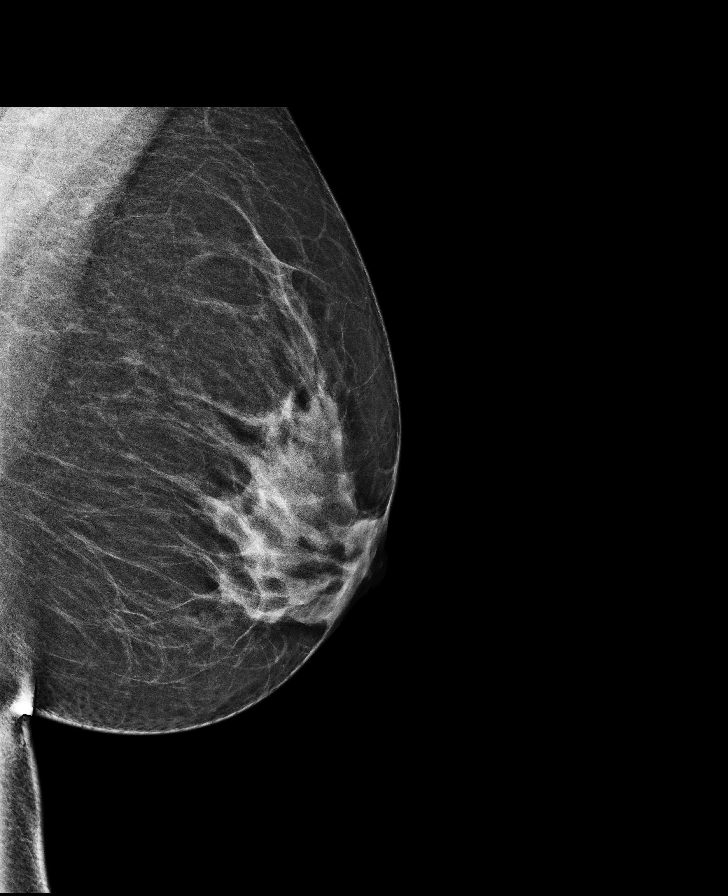

[L CC synth-2D]
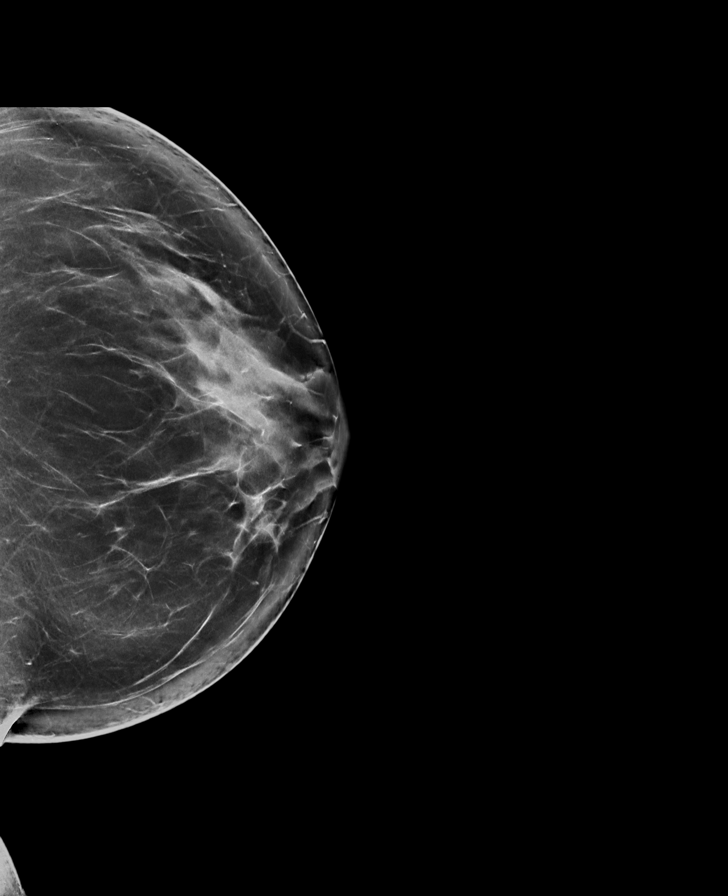

[8 of 28 positions shown; findings below may reference images not displayed]

ACR Breast Density Category c: The breast tissue is heterogeneously
dense, which may obscure small masses.
FINDINGS: There are no findings suspicious for malignancy. Images were
processed with CAD.
IMPRESSION: No mammographic evidence of malignancy. A result letter of this
screening mammogram will be mailed directly to the patient.

RECOMMENDATION:
Screening mammogram in one year. (Code:[id number])

BI-RADS CATEGORY  1: Negative.

## 2017-03-25 DIAGNOSIS — Z23 Encounter for immunization: Secondary | ICD-10-CM | POA: Diagnosis not present

## 2017-04-04 ENCOUNTER — Other Ambulatory Visit: Payer: Self-pay | Admitting: Occupational Medicine

## 2017-04-04 ENCOUNTER — Ambulatory Visit: Payer: BLUE CROSS/BLUE SHIELD

## 2017-04-04 DIAGNOSIS — M79645 Pain in left finger(s): Secondary | ICD-10-CM

## 2017-05-02 DIAGNOSIS — H401113 Primary open-angle glaucoma, right eye, severe stage: Secondary | ICD-10-CM | POA: Diagnosis not present

## 2017-05-02 DIAGNOSIS — H401121 Primary open-angle glaucoma, left eye, mild stage: Secondary | ICD-10-CM | POA: Diagnosis not present

## 2017-05-02 DIAGNOSIS — H2513 Age-related nuclear cataract, bilateral: Secondary | ICD-10-CM | POA: Diagnosis not present

## 2017-06-02 ENCOUNTER — Ambulatory Visit
Admission: RE | Admit: 2017-06-02 | Discharge: 2017-06-02 | Disposition: A | Discharge status: Home / Self Care | Payer: BLUE CROSS/BLUE SHIELD | Source: Ambulatory Visit | Attending: Internal Medicine | Admitting: Internal Medicine

## 2017-06-02 DIAGNOSIS — Z1231 Encounter for screening mammogram for malignant neoplasm of breast: Secondary | ICD-10-CM | POA: Diagnosis not present

## 2017-06-21 DIAGNOSIS — Z0000 Encounter for general adult medical examination without abnormal findings: Secondary | ICD-10-CM | POA: Diagnosis not present

## 2017-06-21 DIAGNOSIS — H4010X0 Unspecified open-angle glaucoma, stage unspecified: Secondary | ICD-10-CM | POA: Diagnosis not present

## 2017-06-21 DIAGNOSIS — I10 Essential (primary) hypertension: Secondary | ICD-10-CM | POA: Diagnosis not present

## 2017-06-21 DIAGNOSIS — E7800 Pure hypercholesterolemia, unspecified: Secondary | ICD-10-CM | POA: Diagnosis not present

## 2017-06-21 DIAGNOSIS — E663 Overweight: Secondary | ICD-10-CM | POA: Diagnosis not present

## 2017-08-15 ENCOUNTER — Encounter (HOSPITAL_COMMUNITY): Payer: Self-pay | Admitting: Family Medicine

## 2017-08-15 ENCOUNTER — Ambulatory Visit (INDEPENDENT_AMBULATORY_CARE_PROVIDER_SITE_OTHER): Payer: BLUE CROSS/BLUE SHIELD

## 2017-08-15 ENCOUNTER — Ambulatory Visit (HOSPITAL_COMMUNITY)
Admission: EM | Admit: 2017-08-15 | Discharge: 2017-08-15 | Disposition: A | Discharge status: Home / Self Care | Payer: BLUE CROSS/BLUE SHIELD | Attending: Family Medicine | Admitting: Family Medicine

## 2017-08-15 DIAGNOSIS — H00014 Hordeolum externum left upper eyelid: Secondary | ICD-10-CM

## 2017-08-15 DIAGNOSIS — M1711 Unilateral primary osteoarthritis, right knee: Secondary | ICD-10-CM | POA: Diagnosis not present

## 2017-08-15 DIAGNOSIS — M25461 Effusion, right knee: Secondary | ICD-10-CM

## 2017-08-15 MED ORDER — PREDNISONE 20 MG PO TABS: ORAL_TABLET | ORAL | 0 refills | Status: DC

## 2017-08-15 MED ORDER — POLYMYXIN B-TRIMETHOPRIM 10000-0.1 UNIT/ML-% OP SOLN: 1.0000 [drp] | OPHTHALMIC | 0 refills | Status: DC

## 2017-08-15 NOTE — ED Provider Notes (Signed)
[facility]   [id number] 08/15/17 Arrival Time: 0959   SUBJECTIVE:  Jillian [name] is a 52 y.o. female who presents to the urgent care with complaint of knee pain for one month and eye irritation overnight.  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  She has had no trauma to her right knee.  Most of the aching is superior to the patella.  She has difficulty completely flexing her knee but no trouble extending it.  She has no localized tenderness.  No other joints are bothering her.  The left eye irritation began overnight.  She feels like there is something in the eye.  She takes glaucoma drops.  Vision is unchanged.   Past Medical History:  Diagnosis Date  . Glaucoma   . Hypertension    Family History  Problem Relation Age of Onset  . Hypertension Mother   . Heart disease Mother   . Diabetes Maternal Uncle    Social History   Socioeconomic History  . Marital status: Married    Spouse name: Not on file  . Number of children: Not on file  . Years of education: Not on file  . Highest education level: Not on file  Occupational History  . Not on file  Social Needs  . Financial resource strain: Not on file  . Food insecurity:    Worry: Not on file    Inability: Not on file  . Transportation needs:    Medical: Not on file    Non-medical: Not on file  Tobacco Use  . Smoking status: Former Smoker    Last attempt to quit: 05/17/1991    Years since quitting: 26.2  . Smokeless tobacco: Never Used  Substance and Sexual Activity  . Alcohol use: Yes    Alcohol/week: 0.0 oz    Comment: occassioonally wine  . Drug use: No  . Sexual activity: Yes    Birth control/protection: Other-see comments, Surgical    Comment: HYST.-1st intercourse 13 yo--5 partners  Lifestyle  . Physical activity:    Days per week: Not on file    Minutes per session: Not on file  . Stress: Not on file  Relationships  . Social connections:    Talks on phone: Not on file    Gets together: Not on file    Attends religious service: Not on file    Active member of club or organization: Not on file    Attends meetings of clubs or organizations: Not on file    Relationship status: Not on file  . Intimate partner violence:    Fear of current or ex partner: Not on file    Emotionally abused: Not on file    Physically abused: Not on file    Forced sexual activity: Not on file  Other Topics Concern  . Not on file  Social History Narrative  . Not on file   No outpatient medications have been marked as taking for the 08/15/17 encounter [facility](Hospital Encounter).   Allergies  Allergen Reactions  . Adhesive [Tape]       ROS: As per HPI, remainder of ROS negative.   OBJECTIVE:   Vitals:   08/15/17 1020  BP: (!) 134/94  Pulse: (!) 58  Resp: 16  Temp: (!) 97.2 F (36.2 C)  TempSrc: Oral  SpO2: 99%     General appearance: alert; no distress Eyes: PERRL; EOMI; conjunctiva normal Small yellow papule left upper lid    HENT: normocephalic; atraumatic; TMs normal,  canal normal, external ears normal without trauma; nasal mucosa normal; oral mucosa normal Neck: supple Back: no CVA tenderness Extremities: no cyanosis or edema; symmetrical with no gross deformities; moderate effusion of the right knee with no localized tenderness.  Full range of motion is present.  Ligament laxity is absent. Skin: warm and dry Neurologic: normal gait; grossly normal Psychological: alert and cooperative; normal mood and affect      Labs:  Results for orders placed or performed in visit on 12/12/16  Urine Culture  Result Value Ref Range   Organism ID, Bacteria NO GROWTH   Urinalysis w microscopic + reflex cultur  Result Value Ref Range   Color, Urine YELLOW YELLOW   APPearance CLEAR CLEAR   Specific Gravity, Urine 1.025 1.001 - 1.035   pH 5.5 5.0 - 8.0   Glucose, UA NEGATIVE NEGATIVE   Bilirubin Urine NEGATIVE NEGATIVE   Ketones, ur NEGATIVE NEGATIVE   Hgb urine  dipstick NEGATIVE NEGATIVE   Protein, ur NEGATIVE NEGATIVE   Nitrite NEGATIVE NEGATIVE   Leukocytes, UA 1+ (A) NEGATIVE   WBC, UA 6-10 (A) <=5 WBC/HPF   RBC / HPF NONE SEEN <=2 RBC/HPF   Squamous Epithelial / LPF 10-20 (A) <=5 HPF   Bacteria, UA MODERATE (A) NONE SEEN HPF   Crystals NONE SEEN NONE SEEN HPF   Casts NONE SEEN NONE SEEN LPF   Yeast NONE SEEN NONE SEEN HPF   Urine-Other SEE NOTE   Pap IG w/ reflex to HPV when ASC-U  Result Value Ref Range   Specimen adequacy: SEE NOTE    FINAL DIAGNOSIS: SEE NOTE    COMMENTS: SEE NOTE    Cytotechnologist: SEE NOTE     Labs Reviewed - No data to display  Dg Knee Complete 4 Views Right  Result Date: 08/15/2017 CLINICAL DATA:  Pain for 2 months EXAM: RIGHT KNEE - COMPLETE 4+ VIEW COMPARISON:  None. FINDINGS: Frontal, lateral, and bilateral oblique views were obtained. No fracture or dislocation. No appreciable joint effusion. There is moderate narrowing medially and in the patellofemoral joint regions. There is spurring in all compartments. There is a spur along the anterior superior patella. No erosive change. IMPRESSION: Osteoarthritic change with narrowing medially into in the patellofemoral joint region. Spurring in all compartments. Spur along the anterior superior patella likely is due to distal quadriceps calcific tendinosis. No fracture or joint effusion. Electronically Signed   By: Bretta Bang III M.D.   On: 08/15/2017 10:59       ASSESSMENT & PLAN:  1. Effusion of bursa of right knee   2. Hordeolum externum of left upper eyelid     Meds ordered this encounter  Medications  . trimethoprim-polymyxin b (POLYTRIM) ophthalmic solution    Sig: Place 1 drop into the left eye every 4 (four) hours.    Dispense:  10 mL    Refill:  0  . predniSONE (DELTASONE) 20 MG tablet    Sig: Two daily with food    Dispense:  10 tablet    Refill:  0    Reviewed expectations re: course of current medical issues. Questions  answered. Outlined signs and symptoms indicating need for more acute intervention. Patient verbalized understanding. After Visit Summary given.    Procedures:      Elvina Sidle, MD 08/15/17 1106

## 2017-08-15 NOTE — ED Triage Notes (Signed)
Pt here with right knee pain and swelling x 1.5 months; pt sts left eye drainage and irritation

## 2017-09-14 DIAGNOSIS — M25561 Pain in right knee: Secondary | ICD-10-CM | POA: Diagnosis not present

## 2017-10-12 DIAGNOSIS — M1711 Unilateral primary osteoarthritis, right knee: Secondary | ICD-10-CM | POA: Diagnosis not present

## 2017-10-19 DIAGNOSIS — M1711 Unilateral primary osteoarthritis, right knee: Secondary | ICD-10-CM | POA: Diagnosis not present

## 2017-10-26 DIAGNOSIS — H2513 Age-related nuclear cataract, bilateral: Secondary | ICD-10-CM | POA: Diagnosis not present

## 2017-10-26 DIAGNOSIS — H401121 Primary open-angle glaucoma, left eye, mild stage: Secondary | ICD-10-CM | POA: Diagnosis not present

## 2017-10-26 DIAGNOSIS — M1711 Unilateral primary osteoarthritis, right knee: Secondary | ICD-10-CM | POA: Diagnosis not present

## 2018-01-11 ENCOUNTER — Encounter: Payer: Self-pay | Admitting: Gynecology

## 2018-01-11 ENCOUNTER — Ambulatory Visit: Payer: BLUE CROSS/BLUE SHIELD | Admitting: Gynecology

## 2018-01-11 VITALS — BP 118/76 | Ht 64.5 in | Wt 183.0 lb

## 2018-01-11 DIAGNOSIS — N941 Unspecified dyspareunia: Secondary | ICD-10-CM

## 2018-01-11 DIAGNOSIS — N952 Postmenopausal atrophic vaginitis: Secondary | ICD-10-CM

## 2018-01-11 DIAGNOSIS — Z01411 Encounter for gynecological examination (general) (routine) with abnormal findings: Secondary | ICD-10-CM | POA: Diagnosis not present

## 2018-01-11 NOTE — Patient Instructions (Signed)
Follow-up in 1 year for annual exam  Call if discomfort with intercourse continues and you want to consider vaginal estrogen.

## 2018-01-11 NOTE — Progress Notes (Signed)
    Bradly ChrisMonica A Donahue 10/08/1965 [id number]        52 y.o.  G1P0010 for annual gynecologic exam.  Complaining of discomfort with intercourse.  Feels like she is being cut in its uncomfortable.  No vaginal dryness or irritation otherwise on a daily basis.  No discharge itching odor.  No urinary symptoms.  Past medical history,surgical history, problem list, medications, allergies, family history and social history were all reviewed and documented as reviewed in the EPIC chart.  ROS:  Performed with pertinent positives and negatives included in the history, assessment and plan.   Additional significant findings : None   Exam: Kennon PortelaKim Gardner assistant Vitals:   01/11/18 0856  BP: 118/76  Weight: 183 lb (83 kg)  Height: 5' 4.5" (1.638 m)   Body mass index is 30.93 kg/m.  General appearance:  Normal affect, orientation and appearance. Skin: Grossly normal HEENT: Without gross lesions.  No cervical or supraclavicular adenopathy. Thyroid normal.  Lungs:  Clear without wheezing, rales or rhonchi Cardiac: RR, without RMG Abdominal:  Soft, nontender, without masses, guarding, rebound, organomegaly or hernia Breasts:  Examined lying and sitting without masses, retractions, discharge or axillary adenopathy. Pelvic:  Ext, BUS, Vagina: With atrophic changes  Adnexa: Without masses or tenderness    Anus and perineum: Normal   Rectovaginal: Normal sphincter tone without palpated masses or tenderness.    Assessment/Plan:  52 y.o. 721P0010 female for annual gynecologic exam, status post TAH BSO for leiomyoma 2009.   1. Postmenopausal/atrophic genital changes/dyspareunia.  Without significant menopausal symptoms such as hot flushes night sweats sleep disturbances.  Is having issues with dyspareunia.  Not having issues vaginally otherwise.  Options for management to include OTC lubricants, global HRT, vaginal HRT reviewed.  She is not having global symptoms she is not interested in global HRT.  Vaginal  HRT with cream, tablets as well as Osphena reviewed.  Risks of absorption with systemic effects to include thrombosis and breast stimulation discussed.  At this point patient is not interested in vaginal supplementation but wants to try OTC products.  Will call if she wants to move on to vaginal estrogen. 2. Mammography 05/2017.  Continue with annual mammography when due.  Breast exam normal today. 3. DEXA 2011 normal.  Plan repeat DEXA further into the menopause. 4. Colonoscopy 2016.  Repeat at their recommended interval. 5. Pap smear 2018.  No Pap smear done today.  No history of abnormal Pap smears.  Options to stop screening per current screening guidelines reviewed.  Will readdress on an annual basis. 6. Health maintenance.  No routine lab work done as patient does this elsewhere.  Follow-up 1 year, sooner as needed.   Dara Lordsimothy P Bevan Disney MD, 9:24 AM 01/11/2018

## 2018-02-01 DIAGNOSIS — Z23 Encounter for immunization: Secondary | ICD-10-CM | POA: Diagnosis not present

## 2018-04-27 DIAGNOSIS — F439 Reaction to severe stress, unspecified: Secondary | ICD-10-CM | POA: Diagnosis not present

## 2018-04-27 DIAGNOSIS — M545 Low back pain: Secondary | ICD-10-CM | POA: Diagnosis not present

## 2018-05-03 DIAGNOSIS — H401121 Primary open-angle glaucoma, left eye, mild stage: Secondary | ICD-10-CM | POA: Diagnosis not present

## 2018-05-03 DIAGNOSIS — H401113 Primary open-angle glaucoma, right eye, severe stage: Secondary | ICD-10-CM | POA: Diagnosis not present

## 2018-06-05 DIAGNOSIS — H401121 Primary open-angle glaucoma, left eye, mild stage: Secondary | ICD-10-CM | POA: Diagnosis not present

## 2018-06-05 DIAGNOSIS — H401113 Primary open-angle glaucoma, right eye, severe stage: Secondary | ICD-10-CM | POA: Diagnosis not present

## 2018-06-05 DIAGNOSIS — H2513 Age-related nuclear cataract, bilateral: Secondary | ICD-10-CM | POA: Diagnosis not present

## 2018-06-27 DIAGNOSIS — Z79891 Long term (current) use of opiate analgesic: Secondary | ICD-10-CM | POA: Diagnosis not present

## 2018-06-27 DIAGNOSIS — F4322 Adjustment disorder with anxiety: Secondary | ICD-10-CM | POA: Diagnosis not present

## 2018-07-31 DIAGNOSIS — Z0000 Encounter for general adult medical examination without abnormal findings: Secondary | ICD-10-CM | POA: Diagnosis not present

## 2018-07-31 DIAGNOSIS — I10 Essential (primary) hypertension: Secondary | ICD-10-CM | POA: Diagnosis not present

## 2018-07-31 DIAGNOSIS — E7800 Pure hypercholesterolemia, unspecified: Secondary | ICD-10-CM | POA: Diagnosis not present

## 2018-07-31 DIAGNOSIS — F331 Major depressive disorder, recurrent, moderate: Secondary | ICD-10-CM | POA: Diagnosis not present

## 2018-12-06 ENCOUNTER — Other Ambulatory Visit: Payer: Self-pay

## 2018-12-06 DIAGNOSIS — Z20822 Contact with and (suspected) exposure to covid-19: Secondary | ICD-10-CM

## 2018-12-06 DIAGNOSIS — R6889 Other general symptoms and signs: Secondary | ICD-10-CM | POA: Diagnosis not present

## 2018-12-10 LAB — NOVEL CORONAVIRUS, NAA: SARS-CoV-2, NAA: NOT DETECTED

## 2019-01-11 ENCOUNTER — Other Ambulatory Visit: Payer: Self-pay

## 2019-01-14 ENCOUNTER — Other Ambulatory Visit: Payer: Self-pay

## 2019-01-14 ENCOUNTER — Ambulatory Visit (INDEPENDENT_AMBULATORY_CARE_PROVIDER_SITE_OTHER): Payer: BC Managed Care – PPO | Admitting: Gynecology

## 2019-01-14 ENCOUNTER — Encounter: Payer: Self-pay | Admitting: Gynecology

## 2019-01-14 VITALS — BP 126/84 | Ht 64.0 in | Wt 193.0 lb

## 2019-01-14 DIAGNOSIS — Z01419 Encounter for gynecological examination (general) (routine) without abnormal findings: Secondary | ICD-10-CM | POA: Diagnosis not present

## 2019-01-14 DIAGNOSIS — N952 Postmenopausal atrophic vaginitis: Secondary | ICD-10-CM | POA: Diagnosis not present

## 2019-01-14 DIAGNOSIS — Z13220 Encounter for screening for lipoid disorders: Secondary | ICD-10-CM | POA: Diagnosis not present

## 2019-01-14 DIAGNOSIS — R5383 Other fatigue: Secondary | ICD-10-CM

## 2019-01-14 DIAGNOSIS — E559 Vitamin D deficiency, unspecified: Secondary | ICD-10-CM | POA: Diagnosis not present

## 2019-01-14 LAB — CBC WITH DIFFERENTIAL/PLATELET
Absolute Monocytes: 415 cells/uL (ref 200–950)
Basophils Absolute: 37 cells/uL (ref 0–200)
Basophils Relative: 0.6 %
Eosinophils Absolute: 112 cells/uL (ref 15–500)
Eosinophils Relative: 1.8 %
HCT: 37.6 % (ref 35.0–45.0)
Hemoglobin: 12.2 g/dL (ref 11.7–15.5)
Lymphs Abs: 2195 cells/uL (ref 850–3900)
MCH: 28.4 pg (ref 27.0–33.0)
MCHC: 32.4 g/dL (ref 32.0–36.0)
MCV: 87.6 fL (ref 80.0–100.0)
MPV: 11.3 fL (ref 7.5–12.5)
Monocytes Relative: 6.7 %
Neutro Abs: 3441 cells/uL (ref 1500–7800)
Neutrophils Relative %: 55.5 %
Platelets: 195 10*3/uL (ref 140–400)
RBC: 4.29 10*6/uL (ref 3.80–5.10)
RDW: 13.2 % (ref 11.0–15.0)
Total Lymphocyte: 35.4 %
WBC: 6.2 10*3/uL (ref 3.8–10.8)

## 2019-01-14 LAB — COMPREHENSIVE METABOLIC PANEL
AG Ratio: 1.7 (calc) (ref 1.0–2.5)
ALT: 13 U/L (ref 6–29)
AST: 17 U/L (ref 10–35)
Albumin: 4.3 g/dL (ref 3.6–5.1)
Alkaline phosphatase (APISO): 70 U/L (ref 37–153)
BUN: 13 mg/dL (ref 7–25)
CO2: 31 mmol/L (ref 20–32)
Calcium: 9.4 mg/dL (ref 8.6–10.4)
Chloride: 103 mmol/L (ref 98–110)
Creat: 0.89 mg/dL (ref 0.50–1.05)
Globulin: 2.6 g/dL (calc) (ref 1.9–3.7)
Glucose, Bld: 88 mg/dL (ref 65–99)
Potassium: 3.8 mmol/L (ref 3.5–5.3)
Sodium: 139 mmol/L (ref 135–146)
Total Bilirubin: 0.2 mg/dL (ref 0.2–1.2)
Total Protein: 6.9 g/dL (ref 6.1–8.1)

## 2019-01-14 LAB — LIPID PANEL
Cholesterol: 171 mg/dL (ref <200)
HDL: 54 mg/dL (ref >=50)
LDL Cholesterol (Calc): 97 mg/dL (calc)
Non-HDL Cholesterol (Calc): 117 mg/dL (calc) (ref <130)
Total CHOL/HDL Ratio: 3.2 (calc) (ref <5.0)
Triglycerides: 108 mg/dL (ref <150)

## 2019-01-14 LAB — VITAMIN D 25 HYDROXY (VIT D DEFICIENCY, FRACTURES): Vit D, 25-Hydroxy: 20 ng/mL — ABNORMAL LOW (ref 30–100)

## 2019-01-14 LAB — TSH: TSH: 0.72 mIU/L

## 2019-01-14 NOTE — Patient Instructions (Signed)
Schedule your mammogram.  Follow-up in 1 year for annual exam. 

## 2019-01-14 NOTE — Progress Notes (Signed)
    Jillian [name] 1965-09-22 [id number]        53 y.o.  G1P0010 for annual gynecologic exam.  Without gynecologic complaints.  Past medical history,surgical history, problem list, medications, allergies, family history and social history were all reviewed and documented as reviewed in the EPIC chart.  ROS:  Performed with pertinent positives and negatives included in the history, assessment and plan.   Additional significant findings : None   Exam: Caryn Bee assistant Vitals:   01/14/19 0801  BP: 126/84  Weight: 193 lb (87.5 kg)  Height: 5\' 4"  (1.626 m)   Body mass index is 33.13 kg/m.  General appearance:  Normal affect, orientation and appearance. Skin: Grossly normal HEENT: Without gross lesions.  No cervical or supraclavicular adenopathy. Thyroid normal.  Lungs:  Clear without wheezing, rales or rhonchi Cardiac: RR, without RMG Abdominal:  Soft, nontender, without masses, guarding, rebound, organomegaly or hernia Breasts:  Examined lying and sitting without masses, retractions, discharge or axillary adenopathy. Pelvic:  Ext, BUS, Vagina: With atrophic changes  Adnexa: Without masses or tenderness    Anus and perineum: Normal   Rectovaginal: Normal sphincter tone without palpated masses or tenderness.    Assessment/Plan:  53 y.o. G32P0010 female for annual gynecologic exam.  Status post TAH/BSO for leiomyoma 2009  1. Postmenopausal.  Without significant menopausal symptoms. 2. Mammography 05/2017.  I reminded patient she is overdue and recommended she call and schedule when she agrees to do so.  Breast exam normal today. 3. DEXA 2011 normal.  Plan repeat DEXA at age 59. 51. Colonoscopy 2016.  Repeat at their recommended interval. 5. Pap smear 2018.  No Pap smear done today.  No history of abnormal Pap smears.  Options to stop screening per current screening guidelines reviewed.  Will readdress on annual basis. 6. Health maintenance.  Complaining of fatigue and some  weight gain.  We discussed possibly related to COVID.  Activities have been limited.  No localizing symptoms as far as hair skin changes diarrhea constipation nausea or vomiting.  Will check baseline CBC, CMP, TSH vitamin D.  We will also check lipid profile now with her drawing blood.  Follow-up 1 year, sooner as needed.   Anastasio Auerbach MD, 8:28 AM 01/14/2019

## 2019-01-15 ENCOUNTER — Other Ambulatory Visit: Payer: Self-pay

## 2019-01-15 DIAGNOSIS — E559 Vitamin D deficiency, unspecified: Secondary | ICD-10-CM

## 2019-01-15 MED ORDER — VITAMIN D (ERGOCALCIFEROL) 1.25 MG (50000 UNIT) PO CAPS: 50000.0000 [IU] | ORAL_CAPSULE | ORAL | 0 refills | Status: DC

## 2019-01-22 DIAGNOSIS — H401121 Primary open-angle glaucoma, left eye, mild stage: Secondary | ICD-10-CM | POA: Diagnosis not present

## 2019-01-22 DIAGNOSIS — H2513 Age-related nuclear cataract, bilateral: Secondary | ICD-10-CM | POA: Diagnosis not present

## 2019-01-22 DIAGNOSIS — H401113 Primary open-angle glaucoma, right eye, severe stage: Secondary | ICD-10-CM | POA: Diagnosis not present

## 2019-02-06 ENCOUNTER — Encounter: Payer: Self-pay | Admitting: Gynecology

## 2019-02-14 DIAGNOSIS — H2511 Age-related nuclear cataract, right eye: Secondary | ICD-10-CM | POA: Diagnosis not present

## 2019-02-14 DIAGNOSIS — H402213 Chronic angle-closure glaucoma, right eye, severe stage: Secondary | ICD-10-CM | POA: Diagnosis not present

## 2019-02-16 DIAGNOSIS — H04123 Dry eye syndrome of bilateral lacrimal glands: Secondary | ICD-10-CM | POA: Diagnosis not present

## 2019-02-16 DIAGNOSIS — H40033 Anatomical narrow angle, bilateral: Secondary | ICD-10-CM | POA: Diagnosis not present

## 2019-04-01 ENCOUNTER — Other Ambulatory Visit: Payer: Self-pay | Admitting: Internal Medicine

## 2019-04-01 DIAGNOSIS — Z1231 Encounter for screening mammogram for malignant neoplasm of breast: Secondary | ICD-10-CM

## 2019-04-15 ENCOUNTER — Other Ambulatory Visit: Payer: Self-pay

## 2019-04-15 DIAGNOSIS — Z20822 Contact with and (suspected) exposure to covid-19: Secondary | ICD-10-CM

## 2019-04-16 LAB — NOVEL CORONAVIRUS, NAA: SARS-CoV-2, NAA: NOT DETECTED

## 2019-05-28 ENCOUNTER — Other Ambulatory Visit: Payer: Self-pay

## 2019-05-28 ENCOUNTER — Ambulatory Visit
Admission: RE | Admit: 2019-05-28 | Discharge: 2019-05-28 | Disposition: A | Discharge status: Home / Self Care | Payer: BC Managed Care – PPO | Source: Ambulatory Visit | Attending: Internal Medicine | Admitting: Internal Medicine

## 2019-05-28 DIAGNOSIS — Z1231 Encounter for screening mammogram for malignant neoplasm of breast: Secondary | ICD-10-CM

## 2019-07-23 DIAGNOSIS — H2512 Age-related nuclear cataract, left eye: Secondary | ICD-10-CM | POA: Diagnosis not present

## 2019-07-23 DIAGNOSIS — H401113 Primary open-angle glaucoma, right eye, severe stage: Secondary | ICD-10-CM | POA: Diagnosis not present

## 2019-07-23 DIAGNOSIS — Z961 Presence of intraocular lens: Secondary | ICD-10-CM | POA: Diagnosis not present

## 2019-07-23 DIAGNOSIS — H401121 Primary open-angle glaucoma, left eye, mild stage: Secondary | ICD-10-CM | POA: Diagnosis not present

## 2019-10-16 DIAGNOSIS — I10 Essential (primary) hypertension: Secondary | ICD-10-CM | POA: Diagnosis not present

## 2019-10-16 DIAGNOSIS — E7800 Pure hypercholesterolemia, unspecified: Secondary | ICD-10-CM | POA: Diagnosis not present

## 2019-10-16 DIAGNOSIS — Z0000 Encounter for general adult medical examination without abnormal findings: Secondary | ICD-10-CM | POA: Diagnosis not present

## 2019-10-16 DIAGNOSIS — F331 Major depressive disorder, recurrent, moderate: Secondary | ICD-10-CM | POA: Diagnosis not present

## 2019-10-16 DIAGNOSIS — H409 Unspecified glaucoma: Secondary | ICD-10-CM | POA: Diagnosis not present

## 2020-01-14 DIAGNOSIS — Z9889 Other specified postprocedural states: Secondary | ICD-10-CM | POA: Diagnosis not present

## 2020-01-14 DIAGNOSIS — H2512 Age-related nuclear cataract, left eye: Secondary | ICD-10-CM | POA: Diagnosis not present

## 2020-01-14 DIAGNOSIS — H401113 Primary open-angle glaucoma, right eye, severe stage: Secondary | ICD-10-CM | POA: Diagnosis not present

## 2020-01-14 DIAGNOSIS — H401121 Primary open-angle glaucoma, left eye, mild stage: Secondary | ICD-10-CM | POA: Diagnosis not present

## 2020-01-16 ENCOUNTER — Encounter: Payer: Self-pay | Admitting: Obstetrics and Gynecology

## 2020-01-16 ENCOUNTER — Ambulatory Visit (INDEPENDENT_AMBULATORY_CARE_PROVIDER_SITE_OTHER): Payer: BC Managed Care – PPO | Admitting: Obstetrics and Gynecology

## 2020-01-16 ENCOUNTER — Other Ambulatory Visit: Payer: Self-pay

## 2020-01-16 VITALS — BP 114/72 | Ht 65.0 in | Wt 197.0 lb

## 2020-01-16 DIAGNOSIS — R1010 Upper abdominal pain, unspecified: Secondary | ICD-10-CM

## 2020-01-16 DIAGNOSIS — L910 Hypertrophic scar: Secondary | ICD-10-CM

## 2020-01-16 DIAGNOSIS — Z01419 Encounter for gynecological examination (general) (routine) without abnormal findings: Secondary | ICD-10-CM

## 2020-01-16 DIAGNOSIS — N951 Menopausal and female climacteric states: Secondary | ICD-10-CM

## 2020-01-16 DIAGNOSIS — R140 Abdominal distension (gaseous): Secondary | ICD-10-CM

## 2020-01-16 DIAGNOSIS — R5383 Other fatigue: Secondary | ICD-10-CM

## 2020-01-16 NOTE — Progress Notes (Signed)
Jillian [name] 1965/11/27 [id number]  SUBJECTIVE:  54 y.o. G1P0010 female for annual routine gynecologic exam and Pap smear. Having some hot flashes and has not tried any OTC products.  Also increased fatigue, works second shift.  Has had some chronic drainage from the surgical incision on her abdomen.  Also having some upper abdominal discomfort and gassiness.  Current Outpatient Medications  Medication Sig Dispense Refill  . Amlodipine Besylate-Valsartan (EXFORGE PO) Take by mouth.      . Bimatoprost (LUMIGAN OP) Apply to eye.    . DiphenhydrAMINE HCl (BENADRYL PO) Take by mouth.      . escitalopram (LEXAPRO) 10 MG tablet Take 10 mg by mouth daily.    . Ferrous Sulfate (IRON PO) Take by mouth.    . fish oil-omega-3 fatty acids 1000 MG capsule Take 2 g by mouth daily.      . hydrochlorothiazide (HYDRODIURIL) 25 MG tablet Take 25 mg by mouth daily.    . Multiple Vitamin (MULTIVITAMIN) capsule Take 1 capsule by mouth daily.      . naproxen (NAPROSYN) 500 MG tablet Take 1 tablet (500 mg total) by mouth 2 (two) times daily with a meal. 30 tablet 0  . rosuvastatin (CRESTOR) 5 MG tablet Take 5 mg by mouth daily.    . timolol (BETIMOL) 0.5 % ophthalmic solution 1 drop 2 (two) times daily.     No current facility-administered medications for this visit.   Allergies: Adhesive [tape]  Patient's last menstrual period was 09/14/2007.  Past medical history,surgical history, problem list, medications, allergies, family history and social history were all reviewed and documented as reviewed in the EPIC chart.  ROS:  Feeling well. No dyspnea or chest pain on exertion.  No abdominal pain, change in bowel habits, black or bloody stools.  No urinary tract symptoms. GYN ROS: no abnormal bleeding, pelvic pain or discharge, no breast pain or new or enlarging lumps on self exam.No neurological complaints.   OBJECTIVE:  BP 114/72   Ht 5\' 5"  (1.651 m)   Wt 197 lb (89.4 kg)   LMP 09/14/2007   BMI  32.78 kg/m  The patient appears well, alert, oriented x 3, in no distress. Abdomen soft without tenderness, guarding, mass or organomegaly.  Mild bloating in upper abdomen.  Moistened area with pinpoint opening at the fold of the incision appears to be a keloid scar, no erythema or purulence.  Nontender. Neurological is normal, no focal findings.  BREAST EXAM: breasts appear normal, no suspicious masses, no skin or nipple changes or axillary nodes  PELVIC EXAM: VULVA: normal appearing vulva with mild atrophic change, no masses, tenderness or lesions, VAGINA: normal appearing vagina with trophic change, normal color and discharge, no lesions, CERVIX: surgically absent, UTERUS: surgically absent, vaginal cuff normal, ADNEXA: no masses, nontender  Chaperone: 11/14/2007 (DNP student) present during the examination and performed the pelvic exam with me in attendance to confirm the exam findings   ASSESSMENT:  54 y.o. G1P0010 here for annual gynecologic exam  PLAN:   1. Postmenopausal. Prior TAH/BSO for leiomyoma 2009.  Having some hot flashes and fatigue.  We encourage exercise regularly as able which can help with energy.  Conservative measures for dealing with hot flashes are reviewed.  I recommended trying OTC products including black cohosh and some soy in the diet twice per day.  If no relief then she should let 2010 know and we can consider estrogen therapy. 2. Pap smear 11/2016.  She indicates no significant history  of abnormal Pap smears.  We discussed that we can stop screening based on current guidelines with prior hysterectomy and she agrees to discontinue screening at this time. 3. Mammogram 05/2019.  Normal breast exam today.  She will continue with annual mammograms. 4. Colonoscopy 2016.  She is to follow up at the recommended interval.   5. DEXA 2011 was normal and will plan to repeat at age 28.  History of low vitamin D noted.  Would recommend rechecking this level if not in her  primary care records.  That test is ordered. 6. Abdominal incision keloid scar.  I recommended that she seek referral for general surgery possible excision of the scar. 7. Upper abdominal discomfort.  I recommended that she confer with her primary care doctor regarding referral to GI.  It sounds like she has had EGD in the past and there were some findings of which she was not certain.  She says she has seen a GI specialist at [facility]. 8. Health maintenance.  No labs today as she normally has these completed with her primary care doctor.   Return annually or sooner, prn.  Theresia Majors MD 01/16/20

## 2020-01-29 DIAGNOSIS — R109 Unspecified abdominal pain: Secondary | ICD-10-CM | POA: Diagnosis not present

## 2020-01-29 DIAGNOSIS — Z23 Encounter for immunization: Secondary | ICD-10-CM | POA: Diagnosis not present

## 2020-01-29 DIAGNOSIS — R112 Nausea with vomiting, unspecified: Secondary | ICD-10-CM | POA: Diagnosis not present

## 2020-01-29 DIAGNOSIS — R6881 Early satiety: Secondary | ICD-10-CM | POA: Diagnosis not present

## 2020-01-29 DIAGNOSIS — R140 Abdominal distension (gaseous): Secondary | ICD-10-CM | POA: Diagnosis not present

## 2020-01-31 ENCOUNTER — Other Ambulatory Visit: Payer: Self-pay | Admitting: Internal Medicine

## 2020-01-31 ENCOUNTER — Ambulatory Visit
Admission: RE | Admit: 2020-01-31 | Discharge: 2020-01-31 | Disposition: A | Discharge status: Home / Self Care | Payer: BC Managed Care – PPO | Source: Ambulatory Visit | Attending: Internal Medicine | Admitting: Internal Medicine

## 2020-01-31 DIAGNOSIS — R6881 Early satiety: Secondary | ICD-10-CM

## 2020-01-31 DIAGNOSIS — R140 Abdominal distension (gaseous): Secondary | ICD-10-CM

## 2020-01-31 DIAGNOSIS — R112 Nausea with vomiting, unspecified: Secondary | ICD-10-CM

## 2020-01-31 DIAGNOSIS — R109 Unspecified abdominal pain: Secondary | ICD-10-CM

## 2020-01-31 DIAGNOSIS — R1010 Upper abdominal pain, unspecified: Secondary | ICD-10-CM | POA: Diagnosis not present

## 2020-01-31 DIAGNOSIS — M47816 Spondylosis without myelopathy or radiculopathy, lumbar region: Secondary | ICD-10-CM | POA: Diagnosis not present

## 2020-01-31 DIAGNOSIS — I700 Atherosclerosis of aorta: Secondary | ICD-10-CM | POA: Diagnosis not present

## 2020-01-31 MED ORDER — IOPAMIDOL (ISOVUE-300) INJECTION 61%
100.0000 mL | Freq: Once | INTRAVENOUS | Status: AC | PRN
  Administered 2020-01-31: 100 mL via INTRAVENOUS

## 2020-03-31 DIAGNOSIS — H2512 Age-related nuclear cataract, left eye: Secondary | ICD-10-CM | POA: Diagnosis not present

## 2020-03-31 DIAGNOSIS — H401121 Primary open-angle glaucoma, left eye, mild stage: Secondary | ICD-10-CM | POA: Diagnosis not present

## 2020-03-31 DIAGNOSIS — Z9889 Other specified postprocedural states: Secondary | ICD-10-CM | POA: Diagnosis not present

## 2020-03-31 DIAGNOSIS — H401113 Primary open-angle glaucoma, right eye, severe stage: Secondary | ICD-10-CM | POA: Diagnosis not present

## 2020-05-15 DIAGNOSIS — Z03818 Encounter for observation for suspected exposure to other biological agents ruled out: Secondary | ICD-10-CM | POA: Diagnosis not present

## 2020-05-19 DIAGNOSIS — Z961 Presence of intraocular lens: Secondary | ICD-10-CM | POA: Diagnosis not present

## 2020-05-19 DIAGNOSIS — H401121 Primary open-angle glaucoma, left eye, mild stage: Secondary | ICD-10-CM | POA: Diagnosis not present

## 2020-05-19 DIAGNOSIS — H401113 Primary open-angle glaucoma, right eye, severe stage: Secondary | ICD-10-CM | POA: Diagnosis not present

## 2020-05-19 DIAGNOSIS — H2512 Age-related nuclear cataract, left eye: Secondary | ICD-10-CM | POA: Diagnosis not present

## 2020-06-26 ENCOUNTER — Other Ambulatory Visit: Payer: Self-pay | Admitting: Internal Medicine

## 2020-06-26 DIAGNOSIS — Z1231 Encounter for screening mammogram for malignant neoplasm of breast: Secondary | ICD-10-CM

## 2020-08-18 ENCOUNTER — Ambulatory Visit
Admission: RE | Admit: 2020-08-18 | Discharge: 2020-08-18 | Disposition: A | Discharge status: Home / Self Care | Payer: BC Managed Care – PPO | Source: Ambulatory Visit | Attending: Internal Medicine | Admitting: Internal Medicine

## 2020-08-18 ENCOUNTER — Other Ambulatory Visit: Payer: Self-pay

## 2020-08-18 DIAGNOSIS — Z1231 Encounter for screening mammogram for malignant neoplasm of breast: Secondary | ICD-10-CM | POA: Diagnosis not present

## 2020-08-20 ENCOUNTER — Other Ambulatory Visit: Payer: Self-pay | Admitting: Internal Medicine

## 2020-08-20 DIAGNOSIS — R928 Other abnormal and inconclusive findings on diagnostic imaging of breast: Secondary | ICD-10-CM

## 2020-09-14 ENCOUNTER — Other Ambulatory Visit: Payer: Self-pay

## 2020-09-14 ENCOUNTER — Ambulatory Visit
Admission: RE | Admit: 2020-09-14 | Discharge: 2020-09-14 | Disposition: A | Discharge status: Home / Self Care | Payer: BC Managed Care – PPO | Source: Ambulatory Visit | Attending: Internal Medicine | Admitting: Internal Medicine

## 2020-09-14 DIAGNOSIS — N6489 Other specified disorders of breast: Secondary | ICD-10-CM | POA: Diagnosis not present

## 2020-09-14 DIAGNOSIS — Z1239 Encounter for other screening for malignant neoplasm of breast: Secondary | ICD-10-CM | POA: Diagnosis not present

## 2020-09-14 DIAGNOSIS — R928 Other abnormal and inconclusive findings on diagnostic imaging of breast: Secondary | ICD-10-CM

## 2020-09-18 DIAGNOSIS — M7989 Other specified soft tissue disorders: Secondary | ICD-10-CM | POA: Diagnosis not present

## 2020-09-23 ENCOUNTER — Ambulatory Visit (INDEPENDENT_AMBULATORY_CARE_PROVIDER_SITE_OTHER): Payer: BC Managed Care – PPO

## 2020-09-23 ENCOUNTER — Encounter (HOSPITAL_COMMUNITY): Payer: Self-pay

## 2020-09-23 ENCOUNTER — Ambulatory Visit (HOSPITAL_COMMUNITY)
Admission: EM | Admit: 2020-09-23 | Discharge: 2020-09-23 | Disposition: A | Discharge status: Home / Self Care | Payer: BC Managed Care – PPO | Attending: Family Medicine | Admitting: Family Medicine

## 2020-09-23 ENCOUNTER — Other Ambulatory Visit: Payer: Self-pay

## 2020-09-23 DIAGNOSIS — M7661 Achilles tendinitis, right leg: Secondary | ICD-10-CM

## 2020-09-23 DIAGNOSIS — M25571 Pain in right ankle and joints of right foot: Secondary | ICD-10-CM | POA: Diagnosis not present

## 2020-09-23 DIAGNOSIS — M7989 Other specified soft tissue disorders: Secondary | ICD-10-CM | POA: Diagnosis not present

## 2020-09-23 DIAGNOSIS — R600 Localized edema: Secondary | ICD-10-CM | POA: Diagnosis not present

## 2020-09-23 DIAGNOSIS — M79604 Pain in right leg: Secondary | ICD-10-CM | POA: Diagnosis not present

## 2020-09-23 MED ORDER — MELOXICAM 15 MG PO TABS: 15.0000 mg | ORAL_TABLET | Freq: Every day | ORAL | 0 refills | Status: DC

## 2020-09-23 NOTE — ED Provider Notes (Signed)
[facility]   [id number] 09/23/20 Arrival Time: 1514  ASSESSMENT & PLAN:  1. Acute right ankle pain   2. Achilles tendinitis of right lower extremity     I have personally viewed the imaging studies ordered this visit. STS. No fracture appreciated.  See AVS for d/c information. Has CAM walker at home to wear for the next week.  Begin: Meds ordered this encounter  Medications  . meloxicam (MOBIC) 15 MG tablet    Sig: Take 1 tablet (15 mg total) by mouth daily.    Dispense:  7 tablet    Refill:  0    Orders Placed This Encounter  Procedures  . DG Ankle Complete Right    Recommend:  Follow-up Information    [facility].   Why: If worsening or failing to improve as anticipated. Contact information: [address] [place] [address] [phone]              Reviewed expectations re: course of current medical issues. Questions answered. Outlined signs and symptoms indicating need for more acute intervention. Patient verbalized understanding. After Visit Summary given.  SUBJECTIVE: History from: patient. Jillian [name] is a 55 y.o. female who reports fairly persistent R ankle pain with swelling; gradual onset first noted approx 3 w ago; no trauma/injury. Now pain localized to rear of foot/ankle with some lateral pain. "Swells sometimes". No extremity sensation changes or weakness. Pain worse with weight bearing and prolonged standing. Tylenol and ice with minimal help.  Past Surgical History:  Procedure Laterality Date  . ABDOMINAL HYSTERECTOMY  2009   TAH,BSO  . CATARACT EXTRACTION    . EYE SURGERY  05/2010   RIGHT  . HEEL SPUR SURGERY  OCT 2009  . MULTIPLE MYOMECTOMY  2001  . OOPHORECTOMY     BSO  . PELVIC LAPAROSCOPY        OBJECTIVE:  Vitals:   09/23/20 1549  BP: 137/84  Pulse: 64  Resp: 20  Temp: 99.2 F (37.3 C)  TempSrc: Oral  SpO2: 99%    General appearance: alert;  no distress HEENT: [place]; AT Neck: supple with FROM Resp: unlabored respirations Extremities: . RUE: warm with well perfused appearance; localized moderate tenderness over right lateral ankle and over Achilles tendon insertion; without gross deformities; swelling: minimal; bruising: none; ankle ROM: normal, with discomfort CV: brisk extremity capillary refill of RLE; 2+ DP pulse of RLE. Skin: warm and dry; no visible rashes Neurologic: gait normal; normal sensation and strength of RLE Psychological: alert and cooperative; normal mood and affect  Imaging: DG Ankle Complete Right  Result Date: 09/23/2020 CLINICAL DATA:  Right ankle pain for 3 weeks without trauma EXAM: RIGHT ANKLE - COMPLETE 3+ VIEW COMPARISON:  None. FINDINGS: Mild bimalleolar soft tissue swelling. No acute fracture or dislocation. Base of fifth metatarsal and talar dome intact. Small Achilles and calcaneal spurs. Mild tibiotalar osteoarthritis. IMPRESSION: Mild bimalleolar soft tissue swelling, without acute osseous finding. Electronically Signed   By: Jeronimo Greaves M.D.   On: 09/23/2020 16:39      Allergies  Allergen Reactions  . Adhesive [Tape]     Past Medical History:  Diagnosis Date  . Elevated cholesterol   . Glaucoma   . Hypertension    Social History   Socioeconomic History  . Marital status: Married    Spouse name: Not on file  . Number of children: Not on file  . Years of education: Not on file  .  Highest education level: Not on file  Occupational History  . Not on file  Tobacco Use  . Smoking status: Former Smoker    Quit date: 05/17/1991    Years since quitting: 29.3  . Smokeless tobacco: Never Used  Vaping Use  . Vaping Use: Never used  Substance and Sexual Activity  . Alcohol use: Yes    Comment: occassionally wine  . Drug use: No  . Sexual activity: Not Currently    Birth control/protection: Other-see comments, Surgical    Comment: HYST.-1st intercourse 13 yo--5 partners  Other Topics  Concern  . Not on file  Social History Narrative  . Not on file   Social Determinants of Health   Financial Resource Strain: Not on file  Food Insecurity: Not on file  Transportation Needs: Not on file  Physical Activity: Not on file  Stress: Not on file  Social Connections: Not on file   Family History  Problem Relation Age of Onset  . Hypertension Mother   . Heart disease Mother   . Diabetes Maternal Uncle   . Cancer Maternal Uncle        ? type   Past Surgical History:  Procedure Laterality Date  . ABDOMINAL HYSTERECTOMY  2009   TAH,BSO  . CATARACT EXTRACTION    . EYE SURGERY  05/2010   RIGHT  . HEEL SPUR SURGERY  OCT 2009  . MULTIPLE MYOMECTOMY  2001  . OOPHORECTOMY     BSO  . PELVIC LAPAROSCOPY        Mardella Layman, MD 09/23/20 1752

## 2020-09-23 NOTE — ED Triage Notes (Addendum)
Pt in with c/o right ankle pain that has been going on for 3 weeks  Pt has been taking tylenol and using ice packs for relief  Swelling noted to right ankle

## 2020-09-23 NOTE — Discharge Instructions (Signed)
Wear your walking boot for the next week.

## 2020-09-28 ENCOUNTER — Other Ambulatory Visit: Payer: Self-pay | Admitting: Internal Medicine

## 2020-09-28 ENCOUNTER — Ambulatory Visit (HOSPITAL_COMMUNITY)
Admission: EM | Admit: 2020-09-28 | Discharge: 2020-09-28 | Disposition: A | Discharge status: Home / Self Care | Payer: BC Managed Care – PPO | Attending: Internal Medicine | Admitting: Internal Medicine

## 2020-09-28 ENCOUNTER — Other Ambulatory Visit: Payer: Self-pay

## 2020-09-28 ENCOUNTER — Telehealth (HOSPITAL_COMMUNITY): Payer: Self-pay | Admitting: Emergency Medicine

## 2020-09-28 DIAGNOSIS — M25571 Pain in right ankle and joints of right foot: Secondary | ICD-10-CM | POA: Diagnosis not present

## 2020-09-28 NOTE — ED Notes (Signed)
CAM boot applied to right foot.

## 2020-09-29 ENCOUNTER — Encounter: Payer: Self-pay | Admitting: Family Medicine

## 2020-09-29 ENCOUNTER — Ambulatory Visit: Payer: Self-pay

## 2020-09-29 ENCOUNTER — Ambulatory Visit: Payer: BC Managed Care – PPO | Admitting: Family Medicine

## 2020-09-29 VITALS — BP 110/82 | Ht 65.0 in | Wt 189.0 lb

## 2020-09-29 DIAGNOSIS — M25571 Pain in right ankle and joints of right foot: Secondary | ICD-10-CM | POA: Diagnosis not present

## 2020-09-29 DIAGNOSIS — M19071 Primary osteoarthritis, right ankle and foot: Secondary | ICD-10-CM | POA: Insufficient documentation

## 2020-09-29 DIAGNOSIS — M7751 Other enthesopathy of right foot: Secondary | ICD-10-CM | POA: Diagnosis not present

## 2020-09-29 MED ORDER — PREDNISONE 5 MG PO TABS: ORAL_TABLET | ORAL | 0 refills | Status: DC

## 2020-09-29 NOTE — Assessment & Plan Note (Signed)
Has mild bursitis and a calcaneal heel spur.  No significant hyperemia -Counseled on home exercise therapy and supportive care. -Prednisone. -Could consider injection physical therapy

## 2020-09-29 NOTE — Patient Instructions (Signed)
Nice to meet you Please try the exercises  Please try to wean out of the boot  Please try the medicine  Please bring your shoes by and I can put insoles in them   Please send me a message in MyChart with any questions or updates.  Please see me back in 4 weeks.   --Dr. Jordan Likes

## 2020-09-29 NOTE — Assessment & Plan Note (Signed)
Has degenerative changes appreciated at the midfoot -Counseled on home exercise therapy and supportive care. -Midfoot arch strap. -Green sport insoles

## 2020-09-29 NOTE — Progress Notes (Signed)
Jillian [name] - 55 y.o. female MRN [id number]  Date of birth: [birth year]  SUBJECTIVE:  Including CC & ROS.  No chief complaint on file.   Jillian [name] is a 55 y.o. female that is presenting with likely pain.  The pain is ongoing for few weeks.  Has been placed in a cam walker with some improvement.  Has tried naproxen as well..  Independent review of the right ankle x-ray from 5/11 shows mild soft tissue swelling around the ankle.   Review of Systems See HPI   HISTORY: Past Medical, Surgical, Social, and Family History Reviewed & Updated per EMR.   Pertinent Historical Findings include:  Past Medical History:  Diagnosis Date  . Elevated cholesterol   . Glaucoma   . Hypertension     Past Surgical History:  Procedure Laterality Date  . ABDOMINAL HYSTERECTOMY  2009   TAH,BSO  . CATARACT EXTRACTION    . EYE SURGERY  05/2010   RIGHT  . HEEL SPUR SURGERY  OCT 2009  . MULTIPLE MYOMECTOMY  2001  . OOPHORECTOMY     BSO  . PELVIC LAPAROSCOPY      Family History  Problem Relation Age of Onset  . Hypertension Mother   . Heart disease Mother   . Diabetes Maternal Uncle   . Cancer Maternal Uncle        ? type    Social History   Socioeconomic History  . Marital status: Married    Spouse name: Not on file  . Number of children: Not on file  . Years of education: Not on file  . Highest education level: Not on file  Occupational History  . Not on file  Tobacco Use  . Smoking status: Former Smoker    Quit date: 05/17/1991    Years since quitting: 29.3  . Smokeless tobacco: Never Used  Vaping Use  . Vaping Use: Never used  Substance and Sexual Activity  . Alcohol use: Yes    Comment: occassionally wine  . Drug use: No  . Sexual activity: Not Currently    Birth control/protection: Other-see comments, Surgical    Comment: HYST.-1st intercourse 13 yo--5 partners  Other Topics Concern  . Not on file  Social History Narrative  . Not on file   Social  Determinants of Health   Financial Resource Strain: Not on file  Food Insecurity: Not on file  Transportation Needs: Not on file  Physical Activity: Not on file  Stress: Not on file  Social Connections: Not on file  Intimate Partner Violence: Not on file     PHYSICAL EXAM:  VS: BP 110/82 (BP Location: Left Arm, Patient Position: Sitting, Cuff Size: Large)   Ht 5\' 5"  (1.651 m)   Wt 189 lb (85.7 kg)   LMP 09/14/2007   BMI 31.45 kg/m  Physical Exam Gen: NAD, alert, cooperative with exam, well-appearing MSK:  Right foot: Tenderness to palpation at the dorsal midfoot. Tenderness palpation at the insertion of the Achilles. Normal range of motion. Normal strength resistance. Neurovascular intact  Limited ultrasound: Right foot and ankle:  No effusion of the ankle joint. Calcaneal heel spur. Normal-appearing Achilles in diameter. Mild retrocalcaneal bursitis. Degenerative changes at the midfoot with mild hyperemia  Summary: Midfoot arthritis and retrocalcaneal bursitis.  Ultrasound and interpretation by 11/14/2007, MD    ASSESSMENT & PLAN:   Retrocalcaneal bursitis (back of heel), right Has mild bursitis and a calcaneal heel spur.  No significant hyperemia -Counseled on home exercise  therapy and supportive care. -Prednisone. -Could consider injection physical therapy  Arthrosis of midfoot, right Has degenerative changes appreciated at the midfoot -Counseled on home exercise therapy and supportive care. -Midfoot arch strap. -Green sport insoles

## 2020-11-05 ENCOUNTER — Ambulatory Visit: Payer: BC Managed Care – PPO | Admitting: Family Medicine

## 2020-11-27 ENCOUNTER — Other Ambulatory Visit: Payer: Self-pay

## 2020-11-27 ENCOUNTER — Ambulatory Visit (HOSPITAL_COMMUNITY)
Admission: EM | Admit: 2020-11-27 | Discharge: 2020-11-27 | Disposition: A | Discharge status: Home / Self Care | Payer: BC Managed Care – PPO | Attending: Physician Assistant | Admitting: Physician Assistant

## 2020-11-27 ENCOUNTER — Encounter (HOSPITAL_COMMUNITY): Payer: Self-pay | Admitting: Emergency Medicine

## 2020-11-27 DIAGNOSIS — L282 Other prurigo: Secondary | ICD-10-CM | POA: Diagnosis not present

## 2020-11-27 DIAGNOSIS — R21 Rash and other nonspecific skin eruption: Secondary | ICD-10-CM | POA: Diagnosis not present

## 2020-11-27 DIAGNOSIS — L500 Allergic urticaria: Secondary | ICD-10-CM

## 2020-11-27 MED ORDER — TRIAMCINOLONE ACETONIDE 0.1 % EX CREA: 1.0000 "application " | TOPICAL_CREAM | Freq: Two times a day (BID) | CUTANEOUS | 0 refills | Status: AC

## 2020-11-27 MED ORDER — FAMOTIDINE 20 MG PO TABS: 20.0000 mg | ORAL_TABLET | Freq: Every day | ORAL | 0 refills | Status: DC

## 2020-11-27 MED ORDER — METHYLPREDNISOLONE ACETATE 80 MG/ML IJ SUSP
60.0000 mg | Freq: Once | INTRAMUSCULAR | Status: AC
  Administered 2020-11-27: 60 mg via INTRAMUSCULAR

## 2020-11-27 MED ORDER — METHYLPREDNISOLONE ACETATE 80 MG/ML IJ SUSP
INTRAMUSCULAR | Status: AC
  Filled 2020-11-27: qty 1

## 2020-11-27 MED ORDER — CETIRIZINE HCL 10 MG PO TABS: 10.0000 mg | ORAL_TABLET | Freq: Every day | ORAL | 0 refills | Status: AC

## 2020-11-27 NOTE — ED Provider Notes (Signed)
[facility]    CSN: [id number] Arrival date & time: 11/27/20  0940      History   Chief Complaint Chief Complaint  Patient presents with   Rash    HPI Jillian [name] is a 55 y.o. female.   Patient presents today with a several hour history of pruritic rash.  Reports that she woke up this morning and had widespread rash that was very itchy.  She denies any changes to her personal hygiene products including soaps or detergents.  Denies any recent antibiotic use or medication changes.  Denies any recent travel.  Denies any known sick contacts or household contacts with similar symptoms.  She has not tried any new foods.  She denies history of food allergies.  Denies history of anaphylaxis.  She has not tried any over-the-counter medications for symptom management.  Denies any recent illness or additional symptoms including cough or congestion.  Denies any difficulty speaking, difficulty swallowing, shortness of breath.  She denies history of dermatological condition and assaulting a dermatologist in the past.   Past Medical History:  Diagnosis Date   Elevated cholesterol    Glaucoma    Hypertension     Patient Active Problem List   Diagnosis Date Noted   Retrocalcaneal bursitis (back of heel), right 09/29/2020   Arthrosis of midfoot, right 09/29/2020   Glaucoma    Hypertension     Past Surgical History:  Procedure Laterality Date   ABDOMINAL HYSTERECTOMY  2009   TAH,BSO   CATARACT EXTRACTION     EYE SURGERY  05/2010   RIGHT   HEEL SPUR SURGERY  OCT 2009   MULTIPLE MYOMECTOMY  2001   OOPHORECTOMY     BSO   PELVIC LAPAROSCOPY      OB History     Gravida  1   Para      Term      Preterm      AB  1   Living  0      SAB  1   IAB      Ectopic      Multiple      Live Births               Home Medications    Prior to Admission medications   Medication Sig Start Date End Date Taking? Authorizing Provider  cetirizine (ZYRTEC  ALLERGY) 10 MG tablet Take 1 tablet (10 mg total) by mouth daily. 11/27/20  Yes Craven Crean K, PA-C  famotidine (PEPCID) 20 MG tablet Take 1 tablet (20 mg total) by mouth at bedtime. 11/27/20  Yes Shanai Lartigue K, PA-C  triamcinolone cream (KENALOG) 0.1 % Apply 1 application topically 2 (two) times daily. 11/27/20  Yes Tenesha Garza K, PA-C  Amlodipine Besylate-Valsartan (EXFORGE PO) Take by mouth.      [provider]  amLODipine-benazepril (LOTREL) 10-20 MG capsule 1 capsule    [provider]  Ascorbic Acid (VITAMIN C) 1000 MG tablet 1 tablet    [provider]  Bimatoprost (LUMIGAN OP) Apply to eye.    [provider]  DiphenhydrAMINE HCl (BENADRYL PO) Take by mouth.      [provider]  escitalopram (LEXAPRO) 10 MG tablet Take 10 mg by mouth daily.    [provider]  Ferrous Sulfate (IRON PO) Take by mouth.    [provider]  fish oil-omega-3 fatty acids 1000 MG capsule Take 2 g by mouth daily.      [provider]  hydrochlorothiazide (HYDRODIURIL) 25 MG tablet Take 25 mg by mouth daily.    [provider]  meloxicam (MOBIC) 15 MG tablet Take 1 tablet (15 mg total) by mouth daily. 09/23/20   Mardella Layman, MD  Multiple Vitamin (MULTIVITAMIN) capsule Take 1 capsule by mouth daily.      [provider]  rosuvastatin (CRESTOR) 5 MG tablet Take 5 mg by mouth daily.    [provider]  timolol (BETIMOL) 0.5 % ophthalmic solution 1 drop 2 (two) times daily.    [provider]    Family History Family History  Problem Relation Age of Onset   Hypertension Mother    Heart disease Mother    Diabetes Maternal Uncle    Cancer Maternal Uncle        ? type    Social History Social History   Tobacco Use   Smoking status: Former    Types: Cigarettes    Quit date: 05/17/1991    Years since quitting: 29.5   Smokeless tobacco: Never  Vaping Use   Vaping Use: Never used  Substance Use  Topics   Alcohol use: Yes    Comment: occassionally wine   Drug use: No     Allergies   Adhesive [tape]   Review of Systems Review of Systems  Constitutional:  Negative for activity change, appetite change, fatigue and fever.  Respiratory:  Negative for cough and shortness of breath.   Cardiovascular:  Negative for chest pain.  Gastrointestinal:  Negative for abdominal pain, diarrhea, nausea and vomiting.  Skin:  Positive for rash.  Neurological:  Negative for dizziness, light-headedness and headaches.    Physical Exam Triage Vital Signs ED Triage Vitals  Enc Vitals Group     BP 11/27/20 1049 (!) 147/85     Pulse Rate 11/27/20 1049 (!) 56     Resp 11/27/20 1049 18     Temp 11/27/20 1049 98.2 F (36.8 C)     Temp src --      SpO2 11/27/20 1049 97 %     Weight --      Height --      Head Circumference --      Peak Flow --      Pain Score 11/27/20 1048 0     Pain Loc --      Pain Edu? --      Excl. in GC? --    No data found.  Updated Vital Signs BP (!) 147/85   Pulse (!) 56   Temp 98.2 F (36.8 C)   Resp 18   LMP 09/14/2007   SpO2 97%   Visual Acuity Right Eye Distance:   Left Eye Distance:   Bilateral Distance:    Right Eye Near:   Left Eye Near:    Bilateral Near:     Physical Exam Vitals reviewed.  Constitutional:      General: She is awake. She is not in acute distress.    Appearance: Normal appearance. She is normal weight. She is not ill-appearing.     Comments: Very pleasant female appears stated age in no acute distress sitting comfortably on exam table  HENT:     Head: Normocephalic and atraumatic.     Mouth/Throat:     Pharynx: Uvula midline. No oropharyngeal exudate or posterior oropharyngeal erythema.     Comments: Normal-appearing posterior oropharynx Cardiovascular:     Rate and Rhythm: Normal rate and regular rhythm.     Heart sounds:  Normal heart sounds, S1 normal and S2 normal. No murmur heard. Pulmonary:     Effort: Pulmonary  effort is normal.     Breath sounds: Normal breath sounds. No wheezing, rhonchi or rales.     Comments: Clear to auscultation bilaterally Skin:    Findings: Rash present. Rash is urticarial.     Comments: Urticarial rash noted bilateral upper length, bilateral forearms, right thumb, posterior neck.  No streaking or evidence of lymphangitis.  No wounds, bleeding, drainage noted.  Psychiatric:        Behavior: Behavior is cooperative.     UC Treatments / Results  Labs (all labs ordered are listed, but only abnormal results are displayed) Labs Reviewed - No data to display  EKG   Radiology No results found.  Procedures Procedures (including critical care time)  Medications Ordered in UC Medications  methylPREDNISolone acetate (DEPO-MEDROL) injection 60 mg (has no administration in time range)    Initial Impression / Assessment and Plan / UC Course  I have reviewed the triage vital signs and the nursing notes.  Pertinent labs & imaging results that were available during my care of the patient were reviewed by me and considered in my medical decision making (see chart for details).      Widespread urticarial rash noted on exam.  Vital signs and physical exam are reassuring with no indication for emergent evaluation or imaging.  Patient was given Depo-Medrol 60 mg in clinic; denies history of diabetes.  She was started on alternating H1 and H2 blockers to help manage symptoms.  She was prescribed triamcinolone to be used on specific areas that are intensely pruritic.  Recommended she use hypoallergenic soaps and detergents.  Encouraged to eat bland foods.  Discussed alarm symptoms that warrant emergent evaluation.  Strict return precautions given to which patient expressed understanding.  Final Clinical Impressions(s) / UC Diagnoses   Final diagnoses:  Allergic urticaria  Rash  Pruritic rash     Discharge Instructions      You were given a steroid injection today.   Please begin alternating antihistamines (Zyrtec 10 mg in the morning and Pepcid 20 mg at night) to help with your symptoms.  Use cool compresses on specific areas.  You can apply triamcinolone to specific areas that are bothersome but do not apply this to areas of surrounding your eyes or genitalia.  If you have any trouble speaking, changes to your voice, shortness of breath, trouble swallowing, swelling of your mouth/throat you need to go to the emergency room.  Follow-up with your primary care provider within 1 week.     ED Prescriptions     Medication Sig Dispense Auth. Provider   cetirizine (ZYRTEC ALLERGY) 10 MG tablet Take 1 tablet (10 mg total) by mouth daily. 30 tablet Dinesha Twiggs K, PA-C   famotidine (PEPCID) 20 MG tablet Take 1 tablet (20 mg total) by mouth at bedtime. 30 tablet Flavius Repsher K, PA-C   triamcinolone cream (KENALOG) 0.1 % Apply 1 application topically 2 (two) times daily. 30 g Maysen Sudol, Noberto Retort, PA-C      PDMP not reviewed this encounter.   Jeani Hawking, PA-C 11/27/20 1108

## 2020-11-27 NOTE — ED Triage Notes (Signed)
Pt is present with a rash on her legs and arms. Pt states that she noticed around 5am this morning. Pt states that the rash is itchy denies any pain.

## 2020-11-27 NOTE — Discharge Instructions (Addendum)
You were given a steroid injection today.  Please begin alternating antihistamines (Zyrtec 10 mg in the morning and Pepcid 20 mg at night) to help with your symptoms.  Use cool compresses on specific areas.  You can apply triamcinolone to specific areas that are bothersome but do not apply this to areas of surrounding your eyes or genitalia.  If you have any trouble speaking, changes to your voice, shortness of breath, trouble swallowing, swelling of your mouth/throat you need to go to the emergency room.  Follow-up with your primary care provider within 1 week.

## 2020-12-01 ENCOUNTER — Other Ambulatory Visit: Payer: Self-pay

## 2020-12-01 ENCOUNTER — Emergency Department (HOSPITAL_COMMUNITY)
Admission: EM | Admit: 2020-12-01 | Discharge: 2020-12-01 | Disposition: A | Discharge status: Home / Self Care | Payer: BC Managed Care – PPO | Attending: Emergency Medicine | Admitting: Emergency Medicine

## 2020-12-01 ENCOUNTER — Encounter (HOSPITAL_COMMUNITY): Payer: Self-pay

## 2020-12-01 DIAGNOSIS — T783XXA Angioneurotic edema, initial encounter: Secondary | ICD-10-CM | POA: Diagnosis not present

## 2020-12-01 DIAGNOSIS — I10 Essential (primary) hypertension: Secondary | ICD-10-CM | POA: Insufficient documentation

## 2020-12-01 DIAGNOSIS — Z79899 Other long term (current) drug therapy: Secondary | ICD-10-CM | POA: Diagnosis not present

## 2020-12-01 DIAGNOSIS — Z87891 Personal history of nicotine dependence: Secondary | ICD-10-CM | POA: Diagnosis not present

## 2020-12-01 DIAGNOSIS — T7840XA Allergy, unspecified, initial encounter: Secondary | ICD-10-CM

## 2020-12-01 DIAGNOSIS — R220 Localized swelling, mass and lump, head: Secondary | ICD-10-CM | POA: Diagnosis not present

## 2020-12-01 LAB — BASIC METABOLIC PANEL
Anion gap: 9 (ref 5–15)
BUN: 13 mg/dL (ref 6–20)
CO2: 26 mmol/L (ref 22–32)
Calcium: 9.6 mg/dL (ref 8.9–10.3)
Chloride: 102 mmol/L (ref 98–111)
Creatinine, Ser: 0.93 mg/dL (ref 0.44–1.00)
GFR, Estimated: >60 mL/min (ref >60)
Glucose, Bld: 118 mg/dL — ABNORMAL HIGH (ref 70–99)
Potassium: 3.4 mmol/L — ABNORMAL LOW (ref 3.5–5.1)
Sodium: 137 mmol/L (ref 135–145)

## 2020-12-01 LAB — CBC
HCT: 38.4 % (ref 36.0–46.0)
Hemoglobin: 12.3 g/dL (ref 12.0–15.0)
MCH: 29.1 pg (ref 26.0–34.0)
MCHC: 32 g/dL (ref 30.0–36.0)
MCV: 91 fL (ref 80.0–100.0)
Platelets: 234 10*3/uL (ref 150–400)
RBC: 4.22 MIL/uL (ref 3.87–5.11)
RDW: 14.1 % (ref 11.5–15.5)
WBC: 6.8 10*3/uL (ref 4.0–10.5)
nRBC: 0 % (ref 0.0–0.2)

## 2020-12-01 MED ORDER — AMLODIPINE BESYLATE 10 MG PO TABS: 10.0000 mg | ORAL_TABLET | Freq: Every day | ORAL | 0 refills | Status: AC

## 2020-12-01 MED ORDER — FAMOTIDINE 20 MG PO TABS: 20.0000 mg | ORAL_TABLET | Freq: Two times a day (BID) | ORAL | 0 refills | Status: AC

## 2020-12-01 MED ORDER — EPINEPHRINE 0.3 MG/0.3ML IJ SOAJ
0.3000 mg | Freq: Once | INTRAMUSCULAR | Status: AC
  Administered 2020-12-01: 0.3 mg via INTRAMUSCULAR
  Filled 2020-12-01: qty 0.3

## 2020-12-01 MED ORDER — FAMOTIDINE IN NACL 20-0.9 MG/50ML-% IV SOLN
20.0000 mg | Freq: Once | INTRAVENOUS | Status: AC
  Administered 2020-12-01: 20 mg via INTRAVENOUS
  Filled 2020-12-01: qty 50

## 2020-12-01 MED ORDER — METHYLPREDNISOLONE SODIUM SUCC 125 MG IJ SOLR
125.0000 mg | Freq: Once | INTRAMUSCULAR | Status: AC
  Administered 2020-12-01: 125 mg via INTRAVENOUS
  Filled 2020-12-01: qty 2

## 2020-12-01 MED ORDER — DIPHENHYDRAMINE HCL 25 MG PO TABS: 25.0000 mg | ORAL_TABLET | Freq: Four times a day (QID) | ORAL | 0 refills | Status: DC | PRN

## 2020-12-01 MED ORDER — EPINEPHRINE 0.3 MG/0.3ML IJ SOAJ: 0.3000 mg | INTRAMUSCULAR | 0 refills | Status: AC | PRN

## 2020-12-01 MED ORDER — DIPHENHYDRAMINE HCL 50 MG/ML IJ SOLN
50.0000 mg | Freq: Once | INTRAMUSCULAR | Status: AC
  Administered 2020-12-01: 50 mg via INTRAVENOUS
  Filled 2020-12-01: qty 1

## 2020-12-01 MED ORDER — PREDNISONE 20 MG PO TABS: ORAL_TABLET | ORAL | 0 refills | Status: DC

## 2020-12-01 NOTE — ED Provider Notes (Signed)
[facility]   CSN: [id number] Arrival date & time: 12/01/20  0553     History Chief Complaint  Patient presents with   Allergic Reaction    Jillian [name] is a 55 y.o. female.  Patient with history of allergic reaction to latex tape presents the emergency department for facial swelling and itching.  She been having intermittent symptoms, worse at night, over the past several days, but much more severe this morning.  Patient reports she was normal when she went to bed and woke up this morning itching.  She noticed that her mouth and lips were very swollen.  Denies swelling of her tongue or throat.  Has associated nausea without vomiting or diarrhea.  Also complains of itchy rash on face, neck, abdomen, legs.  She was given EpiPen, steroids, histamine blockers in triage.   Patient reports she was seen at urgent care for similar several days ago.  She was given a "shot" then discharged home with Pepcid and cetirizine.  He was also given a topical steroid cream.  Reports she was not given oral steroids to take at home.  Reports at that time she had itching but no facial swelling or nausea.  No new food exposures or medications other than what was prescribed for allergic reaction.  She is on combination amlodipine-benazepril.      Past Medical History:  Diagnosis Date   Elevated cholesterol    Glaucoma    Hypertension     Patient Active Problem List   Diagnosis Date Noted   Retrocalcaneal bursitis (back of heel), right 09/29/2020   Arthrosis of midfoot, right 09/29/2020   Glaucoma    Hypertension     Past Surgical History:  Procedure Laterality Date   ABDOMINAL HYSTERECTOMY  2009   TAH,BSO   CATARACT EXTRACTION     EYE SURGERY  05/2010   RIGHT   HEEL SPUR SURGERY  OCT 2009   MULTIPLE MYOMECTOMY  2001   OOPHORECTOMY     BSO   PELVIC LAPAROSCOPY       OB History     Gravida  1   Para      Term       Preterm      AB  1   Living  0      SAB  1   IAB      Ectopic      Multiple      Live Births              Family History  Problem Relation Age of Onset   Hypertension Mother    Heart disease Mother    Diabetes Maternal Uncle    Cancer Maternal Uncle        ? type    Social History   Tobacco Use   Smoking status: Former    Types: Cigarettes    Quit date: 05/17/1991    Years since quitting: 29.5   Smokeless tobacco: Never  Vaping Use   Vaping Use: Never used  Substance Use Topics   Alcohol use: Yes    Comment: occassionally wine   Drug use: No    Home Medications Prior to Admission medications   Medication Sig Start Date End Date Taking? Authorizing Provider  Amlodipine Besylate-Valsartan (EXFORGE PO) Take by mouth.      [provider]  amLODipine-benazepril (LOTREL) 10-20 MG capsule 1 capsule    [provider]  Ascorbic Acid (VITAMIN C)  1000 MG tablet 1 tablet    [provider]  Bimatoprost (LUMIGAN OP) Apply to eye.    [provider]  cetirizine (ZYRTEC ALLERGY) 10 MG tablet Take 1 tablet (10 mg total) by mouth daily. 11/27/20   Raspet, Denny Peon K, PA-C  DiphenhydrAMINE HCl (BENADRYL PO) Take by mouth.      [provider]  escitalopram (LEXAPRO) 10 MG tablet Take 10 mg by mouth daily.    [provider]  famotidine (PEPCID) 20 MG tablet Take 1 tablet (20 mg total) by mouth at bedtime. 11/27/20   Raspet, Noberto Retort, PA-C  Ferrous Sulfate (IRON PO) Take by mouth.    [provider]  fish oil-omega-3 fatty acids 1000 MG capsule Take 2 g by mouth daily.      [provider]  hydrochlorothiazide (HYDRODIURIL) 25 MG tablet Take 25 mg by mouth daily.    [provider]  meloxicam (MOBIC) 15 MG tablet Take 1 tablet (15 mg total) by mouth daily. 09/23/20   Mardella Layman, MD  Multiple Vitamin (MULTIVITAMIN) capsule Take 1 capsule by mouth daily.      [provider]  rosuvastatin  (CRESTOR) 5 MG tablet Take 5 mg by mouth daily.    [provider]  timolol (BETIMOL) 0.5 % ophthalmic solution 1 drop 2 (two) times daily.    [provider]  triamcinolone cream (KENALOG) 0.1 % Apply 1 application topically 2 (two) times daily. 11/27/20   Raspet, Noberto Retort, PA-C    Allergies    Ace inhibitors and Adhesive [tape]  Review of Systems   Review of Systems  Constitutional:  Negative for fever.  HENT:  Positive for facial swelling and sore throat. Negative for trouble swallowing.   Eyes:  Negative for redness.  Respiratory:  Positive for shortness of breath. Negative for wheezing and stridor.   Cardiovascular:  Negative for chest pain.  Gastrointestinal:  Positive for nausea. Negative for vomiting.  Musculoskeletal:  Negative for myalgias.  Skin:  Positive for rash.  Neurological:  Negative for light-headedness.  Psychiatric/Behavioral:  Negative for confusion.    Physical Exam Updated Vital Signs BP 104/82 (BP Location: Right Arm)   Pulse 69   Temp 98.5 F (36.9 C) (Oral)   Resp 18   LMP 09/14/2007   SpO2 98%   Physical Exam Vitals and nursing note reviewed.  Constitutional:      General: She is not in acute distress.    Appearance: She is well-developed.  HENT:     Head: Normocephalic and atraumatic.     Right Ear: External ear normal.     Left Ear: External ear normal.     Nose: Nose normal. No congestion.     Mouth/Throat:     Comments: Patient with mild to moderate lip swelling, mild swelling of the tongue.  Clear speech. Eyes:     Conjunctiva/sclera: Conjunctivae normal.  Cardiovascular:     Rate and Rhythm: Normal rate and regular rhythm.     Heart sounds: No murmur heard. Pulmonary:     Effort: No respiratory distress.     Breath sounds: Wheezing present. No rhonchi or rales.     Comments: Minimal wheezing. Abdominal:     Palpations: Abdomen is soft.     Tenderness: There is no abdominal tenderness. There is no guarding or  rebound.  Musculoskeletal:     Cervical back: Normal range of motion and neck supple.     Right lower leg: No edema.  Left lower leg: No edema.  Skin:    General: Skin is warm and dry.     Findings: No rash.  Neurological:     General: No focal deficit present.     Mental Status: She is alert. Mental status is at baseline.     Motor: No weakness.  Psychiatric:        Mood and Affect: Mood normal.    ED Results / Procedures / Treatments   Labs (all labs ordered are listed, but only abnormal results are displayed) Labs Reviewed  BASIC METABOLIC PANEL - Abnormal; Notable for the following components:      Result Value   Potassium 3.4 (*)    Glucose, Bld 118 (*)    All other components within normal limits  CBC    EKG None  Radiology No results found.  Procedures Procedures   Medications Ordered in ED Medications  EPINEPHrine (EPI-PEN) injection 0.3 mg (0.3 mg Intramuscular Given 12/01/20 0625)  methylPREDNISolone sodium succinate (SOLU-MEDROL) 125 mg/2 mL injection 125 mg (125 mg Intravenous Given 12/01/20 0626)  diphenhydrAMINE (BENADRYL) injection 50 mg (50 mg Intravenous Given 12/01/20 0624)  famotidine (PEPCID) IVPB 20 mg premix (20 mg Intravenous New Bag/Given 12/01/20 9702)    ED Course  I have reviewed the triage vital signs and the nursing notes.  Pertinent labs & imaging results that were available during my care of the patient were reviewed by me and considered in my medical decision making (see chart for details).  Patient seen and examined after treatment for anaphylaxis.  She appears stable now.  She states that her symptoms are gradually improving.  We will continue to monitor.  Disposition based on improvement.  She will need strict antihistamine blockade and prednisone taper at home.  We discussed use of ACE inhibitor and will transition her off combination medication and have her take amlodipine only until she can follow-up with her doctor regarding  this.  Vital signs reviewed and are as follows: BP 104/82 (BP Location: Right Arm)   Pulse 69   Temp 98.5 F (36.9 C) (Oral)   Resp 18   LMP 09/14/2007   SpO2 98%   11:07 AM Pt rechecked. Trace improvement with therapy here. She is seen ambulating in hallway to restroom without any difficulty.   12:12 PM patient discussed with and seen by Dr. Criss Alvine.  Now 5 hours after administration of epinephrine.  Patient is not worsening.  Will plan to discharge to home.  Dr. Criss Alvine in agreement.  Will DC with prednisone, epinephrine, histamine blockade.  Patient instructed to call 911 immediately with worsening of facial swelling or difficulty breathing.  Encouraged return with worsening.  Encourage PCP follow-up to discuss blood pressure management.  Symptoms may be related to benazepril and patient instructed to stop this.  I provided prescription for amlodipine.    MDM Rules/Calculators/A&P                          Patient with angioedema and allergic reaction.  No obvious etiologies.  She is on benazepril and this could be ACE induced angioedema.  Symptoms controlled and not worsening while in the ED.  She did not get much improvement from the angioedema after epinephrine which is consistent with ACE inhibitor angioedema as well.  She is not worsening over 5-hour ED stay.  Plan for discharge as above.     Final Clinical Impression(s) / ED Diagnoses Final diagnoses:  Angioedema, initial encounter  Allergic reaction, initial encounter    Rx / DC Orders ED Discharge Orders          Ordered    predniSONE (DELTASONE) 20 MG tablet        12/01/20 1209    diphenhydrAMINE (BENADRYL) 25 MG tablet  Every 6 hours PRN        12/01/20 1209    famotidine (PEPCID) 20 MG tablet  2 times daily        12/01/20 1209    EPINEPHrine 0.3 mg/0.3 mL IJ SOAJ injection  As needed        12/01/20 1209    amLODipine (NORVASC) 10 MG tablet  Daily        12/01/20 1210             Renne Crigler,  PA-C 12/01/20 1214    Pricilla Loveless, MD 12/01/20 1745

## 2020-12-01 NOTE — Discharge Instructions (Signed)
Please read and follow all provided instructions.  Your diagnoses today include:  1. Angioedema, initial encounter   2. Allergic reaction, initial encounter     Tests performed today include: Vital signs. See below for your results today.   Medications prescribed:  Prednisone - steroid medicine   It is best to take this medication in the morning to prevent sleeping problems. If you are diabetic, monitor your blood sugar closely and stop taking Prednisone if blood sugar is over 300. Take with food to prevent stomach upset.   Pepcid (famotidine) - antihistamine  You can find this medication over-the-counter.   DO NOT exceed:  20mg  Pepcid every 12 hours  Bentyl - medication for intestinal cramps and spasms  Epi-pen Inject into thigh as directed if you have a severe reaction that causes throat swelling or any trouble breathing. Call 9-1-1 immediately if you use an Epi-pen. You should be evaluated at a hospital as soon as possible.    Take any prescribed medications only as directed.  Home care instructions:  Follow any educational materials contained in this packet  ** ** **You should stop taking the combination medication of amlodipine and benazepril.  The medication benazepril could be the cause of your facial swelling.  I have written you a prescription for amlodipine by itself to start taking.  Please follow-up with your primary care doctor to solidify a plan regarding your high blood pressure. ** ** **  Follow-up instructions: Please follow-up with your primary care provider in the next 3 days for further evaluation of your symptoms.   Return instructions:  Please return to the Emergency Department if you experience worsening symptoms.  Call 9-1-1 immediately if you have an allergic reaction that involves your lips, mouth, throat or if you have any difficulty breathing. This is a life-threatening emergency.  Please return if you have any other emergent concerns.  Additional  Information:  Your vital signs today were: BP 129/89   Pulse 68   Temp 98.5 F (36.9 C) (Oral)   Resp 19   LMP 09/14/2007   SpO2 98%  If your blood pressure (BP) was elevated above 135/85 this visit, please have this repeated by your doctor within one month. --------------

## 2020-12-01 NOTE — ED Triage Notes (Signed)
Pt states that she broke out in hives the other day, went to UC, told it was stress woke up tonight with swelling to lips and mouth, on a pril.

## 2020-12-01 NOTE — ED Provider Notes (Signed)
Emergency Medicine Provider Triage Evaluation Note  Jillian [name] , a 55 y.o. female  was evaluated in triage.  Pt complains of facial swelling and itching.  Patient reports she was normal when she went to bed and woke up this morning itching.  The noticed that her mouth and lips were very swollen.  Denies swelling of her tongue or throat.  Has associated nausea.  Is concerned that she may also have urticaria.  No aggravating or alleviating factors.  Denies vomiting or diarrhea.  Patient reports she was seen for similar several days ago.  She was given a "shot" then discharged home with Pepcid and cetirizine.  Reports she was not given steroids to take at home.  Reports at that time she had itching but no facial swelling or nausea.  Review of Systems  Positive: Facial swelling, urticaria, nausea Negative: Vomiting, diarrhea, weakness, dizziness  Physical Exam  BP 104/82 (BP Location: Right Arm)   Pulse 69   Temp 98.5 F (36.9 C) (Oral)   Resp 18   LMP 09/14/2007   SpO2 98%  Gen:   Awake, no distress   Resp:  Normal effort, clear and equal breath sounds MSK:   Moves extremities without difficulty  Other:  Significant swelling of the lips and lower face.  No swelling of the tongue.  No stridor.  Handling secretions without difficulty.  Medical Decision Making  Medically screening exam initiated at 6:17 AM.  Appropriate orders placed.  Jillian [name] was informed that the remainder of the evaluation will be completed by another provider, this initial triage assessment does not replace that evaluation, and the importance of remaining in the ED until their evaluation is complete.  Angioedema - likely 2/2 amlodipine-benazepril.  Epi, Solu-Medrol, Pepcid and Benadryl given.  Basic labs drawn.  Patient may need admission for observation.   Jillian [name], Jillian [name] 12/01/20 1219    Jillian Memos, MD 12/01/20 [phone]

## 2021-01-19 DIAGNOSIS — Z13220 Encounter for screening for lipoid disorders: Secondary | ICD-10-CM | POA: Diagnosis not present

## 2021-01-19 DIAGNOSIS — I10 Essential (primary) hypertension: Secondary | ICD-10-CM | POA: Diagnosis not present

## 2021-01-19 DIAGNOSIS — Z0000 Encounter for general adult medical examination without abnormal findings: Secondary | ICD-10-CM | POA: Diagnosis not present

## 2021-01-19 DIAGNOSIS — H409 Unspecified glaucoma: Secondary | ICD-10-CM | POA: Diagnosis not present

## 2021-01-19 DIAGNOSIS — E7800 Pure hypercholesterolemia, unspecified: Secondary | ICD-10-CM | POA: Diagnosis not present

## 2021-01-19 DIAGNOSIS — Z23 Encounter for immunization: Secondary | ICD-10-CM | POA: Diagnosis not present

## 2021-01-19 DIAGNOSIS — R0789 Other chest pain: Secondary | ICD-10-CM | POA: Diagnosis not present

## 2021-01-21 ENCOUNTER — Telehealth: Payer: Self-pay | Admitting: *Deleted

## 2021-01-21 ENCOUNTER — Other Ambulatory Visit: Payer: Self-pay

## 2021-01-21 ENCOUNTER — Encounter: Payer: Self-pay | Admitting: Obstetrics and Gynecology

## 2021-01-21 ENCOUNTER — Ambulatory Visit (INDEPENDENT_AMBULATORY_CARE_PROVIDER_SITE_OTHER): Payer: BC Managed Care – PPO | Admitting: Obstetrics and Gynecology

## 2021-01-21 ENCOUNTER — Encounter: Payer: BC Managed Care – PPO | Admitting: Obstetrics and Gynecology

## 2021-01-21 VITALS — BP 134/82 | HR 57 | Resp 97 | Ht 64.25 in | Wt 210.0 lb

## 2021-01-21 DIAGNOSIS — N952 Postmenopausal atrophic vaginitis: Secondary | ICD-10-CM

## 2021-01-21 DIAGNOSIS — Z01419 Encounter for gynecological examination (general) (routine) without abnormal findings: Secondary | ICD-10-CM

## 2021-01-21 MED ORDER — ESTRADIOL 10 MCG VA TABS: ORAL_TABLET | VAGINAL | 3 refills | Status: DC

## 2021-01-21 NOTE — Patient Instructions (Signed)

## 2021-01-21 NOTE — Telephone Encounter (Signed)
PA done via cover my meds for generic vagifem 10 mcg tablet, will wait for response from insurance.

## 2021-01-21 NOTE — Progress Notes (Signed)
55 y.o. G77P0010 Married [place] American female here for annual exam.    Patient  not sexually active due to vaginal dryness.  Difficulty with fluid in LEs and has difficulty climbing a flight of stairs.  She had an EKG showing low heart rate.   Has generalized joint pain.   Likes music and nature.   Husband with back issues.  Bilateral amputation due to DM.  Finger tips also amputated. He will be coming back home soon.  Patient is her caregiver and feels tired.   PCP:  Renford Dills, MD   Patient's last menstrual period was 09/14/2007.           Sexually active: No.  The current method of family planning is status post hysterectomy.    Exercising: No.  The patient does not participate in regular exercise at present.  Caregiver to husband Smoker:  no  Health Maintenance: Pap:  12-12-16 Neg, 07-09-14 Neg History of abnormal Pap:  no MMG:  08-18-20 3D/Neg/BiRads1 Colonoscopy:  PCP scheduling.  History of colon polyps.   BMD: 2011 Result :Normal TDaP: 01-19-21 Gardasil:   no HIV:no Hep C:no Screening Labs:  PCP/   reports that she quit smoking about 29 years ago. Her smoking use included cigarettes. She has never used smokeless tobacco. She reports current alcohol use of about 2.0 standard drinks per week. She reports that she does not use drugs.  Past Medical History:  Diagnosis Date   Elevated cholesterol    Glaucoma    Hypertension     Past Surgical History:  Procedure Laterality Date   ABDOMINAL HYSTERECTOMY  2009   TAH,BSO   CATARACT EXTRACTION     EYE SURGERY  05/2010   RIGHT   HEEL SPUR SURGERY  OCT 2009   MULTIPLE MYOMECTOMY  2001   OOPHORECTOMY     BSO   PELVIC LAPAROSCOPY      Current Outpatient Medications  Medication Sig Dispense Refill   acetaminophen (TYLENOL) 500 MG tablet Take 1,000 mg by mouth daily.     amLODipine (NORVASC) 10 MG tablet Take 1 tablet (10 mg total) by mouth daily. 30 tablet 0   Ascorbic Acid (VITAMIN C) 1000 MG tablet Take  1,000 mg by mouth daily.     cetirizine (ZYRTEC ALLERGY) 10 MG tablet Take 1 tablet (10 mg total) by mouth daily. 30 tablet 0   diphenhydrAMINE (BENADRYL) 25 MG tablet Take 1 tablet (25 mg total) by mouth every 6 (six) hours as needed. 20 tablet 0   EPINEPHrine 0.3 mg/0.3 mL IJ SOAJ injection Inject 0.3 mg into the muscle as needed for anaphylaxis. 1 each 0   escitalopram (LEXAPRO) 20 MG tablet Take 20 mg by mouth daily.     famotidine (PEPCID) 20 MG tablet Take 1 tablet (20 mg total) by mouth 2 (two) times daily. 10 tablet 0   Flaxseed, Linseed, (FLAXSEED OIL) 1200 MG CAPS Take 1,200 mg by mouth daily.     hydrochlorothiazide (HYDRODIURIL) 25 MG tablet Take 25 mg by mouth daily.     latanoprost (XALATAN) 0.005 % ophthalmic solution Place 1 drop into both eyes at bedtime.     meloxicam (MOBIC) 15 MG tablet Take 1 tablet (15 mg total) by mouth daily. 7 tablet 0   Multiple Vitamin (MULTIVITAMIN WITH MINERALS) TABS tablet Take 1 tablet by mouth daily.     Probiotic Product (PROBIOTIC PO) Take 1 tablet by mouth daily.     rosuvastatin (CRESTOR) 10 MG tablet Take 10 mg by  mouth at bedtime.     timolol (BETIMOL) 0.5 % ophthalmic solution 1 drop 2 (two) times daily.     triamcinolone cream (KENALOG) 0.1 % Apply 1 application topically 2 (two) times daily. 30 g 0   No current facility-administered medications for this visit.    Family History  Problem Relation Age of Onset   Hypertension Mother    Heart disease Mother    Diabetes Maternal Uncle    Cancer Maternal Uncle        ? type    Review of Systems  All other systems reviewed and are negative.  Exam:   BP 134/82   Pulse (!) 57   Resp (!) 97   Ht 5' 4.25" (1.632 m)   Wt 210 lb (95.3 kg)   LMP 09/14/2007   BMI 35.77 kg/m     General appearance: alert, cooperative and appears stated age Head: normocephalic, without obvious abnormality, atraumatic Neck: no adenopathy, supple, symmetrical, trachea midline and thyroid normal to  inspection and palpation Lungs: clear to auscultation bilaterally Breasts: normal appearance, no masses or tenderness, No nipple retraction or dimpling, No nipple discharge or bleeding, No axillary adenopathy Heart: regular rate and rhythm Abdomen: soft, non-tender; no masses, no organomegaly Extremities: extremities normal, atraumatic, no cyanosis or edema Skin: skin color, texture, turgor normal. No rashes or lesions Lymph nodes: cervical, supraclavicular, and axillary nodes normal. Neurologic: grossly normal  Pelvic: External genitalia:  no lesions              No abnormal inguinal nodes palpated.              Urethra:  normal appearing urethra with no masses, tenderness or lesions              Bartholins and Skenes: normal                 Vagina: erythema of mucosa consistent with atropy.              Cervix: absent              Pap taken: no Bimanual Exam:  Uterus:  absent              Adnexa: no mass, fullness, tenderness              Rectal exam: yes Confirms.              Anus:  normal sphincter tone, no lesions  Chaperone was present for exam:  Marchelle Folks CMA.   Assessment:   Well woman visit with gynecologic exam. Status post TAH/BSO - fibroid. Atrophy.  Caregiver stress.   Plan: Mammogram screening discussed. Self breast awareness reviewed. Pap and HR HPV as above. Guidelines for Calcium, Vitamin D, regular exercise program including cardiovascular and weight bearing exercise. Water based lubricants, cooking oils, and vaginal estrogen discussed.  She will start Vagifem tablets.  Instructed in use. I discussed potential effect on breast cancer.  She will reach out to a Child psychotherapist at hospital where husband is to start doing home health care planning.  I encouraged her to follow up with her PCP regarding her heart concerns.  Follow up annually and prn.   After visit summary provided.

## 2021-01-25 MED ORDER — VAGIFEM 10 MCG VA TABS: ORAL_TABLET | VAGINAL | 3 refills | Status: DC

## 2021-01-25 NOTE — Telephone Encounter (Signed)
[employer] sent response back stating brand Vagifem 10 mcg tablet is covered and generic Yuvfem has been denied. I discussed this with patient and informed her that brand is covered per [facility] and I will send in this Rx.

## 2021-05-26 DIAGNOSIS — H401113 Primary open-angle glaucoma, right eye, severe stage: Secondary | ICD-10-CM | POA: Diagnosis not present

## 2021-05-26 DIAGNOSIS — H401121 Primary open-angle glaucoma, left eye, mild stage: Secondary | ICD-10-CM | POA: Diagnosis not present

## 2021-05-26 DIAGNOSIS — Z961 Presence of intraocular lens: Secondary | ICD-10-CM | POA: Diagnosis not present

## 2021-05-26 DIAGNOSIS — H2512 Age-related nuclear cataract, left eye: Secondary | ICD-10-CM | POA: Diagnosis not present

## 2021-07-09 DIAGNOSIS — Z961 Presence of intraocular lens: Secondary | ICD-10-CM | POA: Diagnosis not present

## 2021-07-09 DIAGNOSIS — H401121 Primary open-angle glaucoma, left eye, mild stage: Secondary | ICD-10-CM | POA: Diagnosis not present

## 2021-07-09 DIAGNOSIS — H401113 Primary open-angle glaucoma, right eye, severe stage: Secondary | ICD-10-CM | POA: Diagnosis not present

## 2021-07-09 DIAGNOSIS — H2512 Age-related nuclear cataract, left eye: Secondary | ICD-10-CM | POA: Diagnosis not present

## 2021-07-22 DIAGNOSIS — E7800 Pure hypercholesterolemia, unspecified: Secondary | ICD-10-CM | POA: Diagnosis not present

## 2021-07-22 DIAGNOSIS — R600 Localized edema: Secondary | ICD-10-CM | POA: Diagnosis not present

## 2021-07-22 DIAGNOSIS — I10 Essential (primary) hypertension: Secondary | ICD-10-CM | POA: Diagnosis not present

## 2021-07-22 DIAGNOSIS — F331 Major depressive disorder, recurrent, moderate: Secondary | ICD-10-CM | POA: Diagnosis not present

## 2021-08-07 ENCOUNTER — Encounter (HOSPITAL_COMMUNITY): Payer: Self-pay | Admitting: *Deleted

## 2021-08-07 ENCOUNTER — Other Ambulatory Visit: Payer: Self-pay

## 2021-08-07 ENCOUNTER — Ambulatory Visit (INDEPENDENT_AMBULATORY_CARE_PROVIDER_SITE_OTHER): Payer: BC Managed Care – PPO

## 2021-08-07 ENCOUNTER — Ambulatory Visit (HOSPITAL_COMMUNITY): Payer: BC Managed Care – PPO

## 2021-08-07 ENCOUNTER — Ambulatory Visit (HOSPITAL_COMMUNITY)
Admission: EM | Admit: 2021-08-07 | Discharge: 2021-08-07 | Disposition: A | Discharge status: Home / Self Care | Payer: BC Managed Care – PPO | Attending: Nurse Practitioner | Admitting: Nurse Practitioner

## 2021-08-07 DIAGNOSIS — M7989 Other specified soft tissue disorders: Secondary | ICD-10-CM | POA: Diagnosis not present

## 2021-08-07 DIAGNOSIS — M25471 Effusion, right ankle: Secondary | ICD-10-CM

## 2021-08-07 DIAGNOSIS — M25571 Pain in right ankle and joints of right foot: Secondary | ICD-10-CM | POA: Diagnosis not present

## 2021-08-07 MED ORDER — NAPROXEN 500 MG PO TABS: 500.0000 mg | ORAL_TABLET | Freq: Two times a day (BID) | ORAL | 0 refills | Status: DC

## 2021-08-07 NOTE — Discharge Instructions (Signed)
-   The ankle x-ray today does not show any acute bony fracture.  I suspect some of the ligaments in your ankle may be strain. ?- We have put a ankle stabilizing brace in your ankle today ?-Please elevate your ankle, use ice to help with the swelling, and you can take naproxen 500 mg every 12 hours as needed for pain. ?-If the swelling and pain persist, please follow-up either with a tried foot and ankle center, or the sports medicine clinic.  The numbers are provided below. ? ?

## 2021-08-07 NOTE — ED Provider Notes (Signed)
?[facility] ? ? ? ?CSN: [id number] ?Arrival date & time: 08/07/21  1750 ? ? ?  ? ?History   ?Chief Complaint ?Chief Complaint  ?Patient presents with  ? Joint Swelling  ? ? ?HPI ?Jillian [name] is a 56 y.o. female.  ? ?Patient presents today with off and on ankle swelling for the past few weeks.  Denies any recent fall, accident, injury to the right ankle.  She denies any numbness or tingling in her toes, decreased range of motion to the ankle.  She reports the ankle swelling is worse after standing on it all day.  She denies fevers, nausea, vomiting.  She has not taken anything for the pain. ? ? ?Past Medical History:  ?Diagnosis Date  ? Elevated cholesterol   ? Glaucoma   ? Hypertension   ? ? ?Patient Active Problem List  ? Diagnosis Date Noted  ? Retrocalcaneal bursitis (back of heel), right 09/29/2020  ? Arthrosis of midfoot, right 09/29/2020  ? Glaucoma   ? Hypertension   ? ? ?Past Surgical History:  ?Procedure Laterality Date  ? ABDOMINAL HYSTERECTOMY  2009  ? TAH,BSO  ? CATARACT EXTRACTION    ? EYE SURGERY  05/2010  ? RIGHT  ? HEEL SPUR SURGERY  OCT 2009  ? MULTIPLE MYOMECTOMY  2001  ? OOPHORECTOMY    ? BSO  ? PELVIC LAPAROSCOPY    ? ? ?OB History   ? ? Gravida  ?1  ? Para  ?   ? Term  ?   ? Preterm  ?   ? AB  ?1  ? Living  ?0  ?  ? ? SAB  ?1  ? IAB  ?   ? Ectopic  ?   ? Multiple  ?   ? Live Births  ?   ?   ?  ?  ? ? ? ?Home Medications   ? ?Prior to Admission medications   ?Medication Sig Start Date End Date Taking? Authorizing Provider  ?naproxen (NAPROSYN) 500 MG tablet Take 1 tablet (500 mg total) by mouth 2 (two) times daily. Take with food to prevent GI upset 08/07/21  Yes Cathlean MarseillesMartinez, Arlin Savona A, NP  ?acetaminophen (TYLENOL) 500 MG tablet Take 1,000 mg by mouth daily.    [provider]  ?amLODipine (NORVASC) 10 MG tablet Take 1 tablet (10 mg total) by mouth daily. 12/01/20   Renne CriglerGeiple, Joshua, PA-C  ?Ascorbic Acid (VITAMIN C) 1000 MG tablet Take 1,000 mg by mouth daily.    [provider]  ?cetirizine (ZYRTEC ALLERGY) 10 MG tablet Take 1 tablet (10 mg total) by mouth daily. 11/27/20   Raspet, Noberto RetortErin K, PA-C  ?diphenhydrAMINE (BENADRYL) 25 MG tablet Take 1 tablet (25 mg total) by mouth every 6 (six) hours as needed. 12/01/20   Renne CriglerGeiple, Joshua, PA-C  ?EPINEPHrine 0.3 mg/0.3 mL IJ SOAJ injection Inject 0.3 mg into the muscle as needed for anaphylaxis. 12/01/20   Renne CriglerGeiple, Joshua, PA-C  ?escitalopram (LEXAPRO) 20 MG tablet Take 20 mg by mouth daily. 01/19/21   [provider]  ?famotidine (PEPCID) 20 MG tablet Take 1 tablet (20 mg total) by mouth 2 (two) times daily. 12/01/20   Renne CriglerGeiple, Joshua, PA-C  ?Flaxseed, Linseed, (FLAXSEED OIL) 1200 MG CAPS Take 1,200 mg by mouth daily.    [provider]  ?hydrochlorothiazide (HYDRODIURIL) 25 MG tablet Take 25 mg by mouth daily.    [provider]  ?latanoprost (XALATAN) 0.005 % ophthalmic solution Place 1 drop into both  eyes at bedtime. 08/10/20   [provider]  ?Multiple Vitamin (MULTIVITAMIN WITH MINERALS) TABS tablet Take 1 tablet by mouth daily.    [provider]  ?Probiotic Product (PROBIOTIC PO) Take 1 tablet by mouth daily.    [provider]  ?rosuvastatin (CRESTOR) 10 MG tablet Take 10 mg by mouth at bedtime. 01/19/21   [provider]  ?timolol (BETIMOL) 0.5 % ophthalmic solution 1 drop 2 (two) times daily.    [provider]  ?triamcinolone cream (KENALOG) 0.1 % Apply 1 application topically 2 (two) times daily. 11/27/20   Raspet, Noberto Retort, PA-C  ?VAGIFEM 10 MCG TABS vaginal tablet Place one tablet (10 mcg) per vagina every night before bed for two weeks when you first begin this medicine, then after the first two weeks, begin using it twice a week. 01/25/21   Patton Salles, MD  ? ? ?Family History ?Family History  ?Problem Relation Age of Onset  ? Hypertension Mother   ? Heart disease Mother   ? Diabetes Maternal Uncle   ? Cancer Maternal Uncle   ?     ? type   ? ? ?Social History ?Social History  ? ?Tobacco Use  ? Smoking status: Former  ?  Types: Cigarettes  ?  Quit date: 05/17/1991  ?  Years since quitting: 30.2  ? Smokeless tobacco: Never  ?Vaping Use  ? Vaping Use: Never used  ?Substance Use Topics  ? Alcohol use: Yes  ?  Alcohol/week: 2.0 standard drinks  ?  Types: 2 Glasses of wine per week  ? Drug use: No  ? ? ? ?Allergies   ?Latex, Ace inhibitors, and Adhesive [tape] ? ? ?Review of Systems ?Review of Systems ?Per HPI ? ?Physical Exam ?Triage Vital Signs ?ED Triage Vitals  ?Enc Vitals Group  ?   BP 08/07/21 1816 (!) 142/87  ?   Pulse Rate 08/07/21 1816 60  ?   Resp --   ?   Temp 08/07/21 1816 98.4 ?F (36.9 ?C)  ?   Temp src --   ?   SpO2 08/07/21 1816 96 %  ?   Weight --   ?   Height --   ?   Head Circumference --   ?   Peak Flow --   ?   Pain Score 08/07/21 1814 7  ?   Pain Loc --   ?   Pain Edu? --   ?   Excl. in GC? --   ? ?No data found. ? ?Updated Vital Signs ?BP (!) 142/87   Pulse 60   Temp 98.4 ?F (36.9 ?C)   LMP 09/14/2007   SpO2 96%  ? ?Visual Acuity ?Right Eye Distance:   ?Left Eye Distance:   ?Bilateral Distance:   ? ?Right Eye Near:   ?Left Eye Near:    ?Bilateral Near:    ? ?Physical Exam ?Vitals and nursing note reviewed.  ?Constitutional:   ?   General: She is not in acute distress. ?   Appearance: Normal appearance. She is not toxic-appearing.  ?Cardiovascular:  ?   Pulses:     ?     Dorsalis pedis pulses are 2+ on the right side.  ?     Posterior tibial pulses are 2+ on the right side.  ?Musculoskeletal:     ?   General: Normal range of motion.  ?   Right ankle: Swelling present. Tenderness present over the lateral malleolus, ATF ligament, CF ligament  and posterior TF ligament. Normal range of motion. Normal pulse.  ?   Right Achilles Tendon: Normal.  ?   Left ankle: Normal.  ?   Comments: Swelling around lateral malleolus; no erythema, redness, or warmth  ?Feet:  ?   Right foot:  ?   Skin integrity: Skin integrity normal.  ?   Left foot:  ?    Skin integrity: Skin integrity normal.  ?Skin: ?   General: Skin is warm and dry.  ?   Capillary Refill: Capillary refill takes less than 2 seconds.  ?   Coloration: Skin is not jaundiced or pale.  ?   Findings: No erythema.  ?Neurological:  ?   Mental Status: She is alert and oriented to person, place, and time.  ?   Motor: No weakness.  ?   Gait: Gait normal.  ?Psychiatric:     ?   Mood and Affect: Mood normal.     ?   Behavior: Behavior normal.  ? ? ? ?UC Treatments / Results  ?Labs ?(all labs ordered are listed, but only abnormal results are displayed) ?Labs Reviewed - No data to display ? ?EKG ? ? ?Radiology ?DG Ankle Complete Right ? ?Result Date: 08/07/2021 ?CLINICAL DATA:  Right ankle pain. EXAM: RIGHT ANKLE - COMPLETE 3+ VIEW COMPARISON:  Sep 23, 2020 FINDINGS: There is no evidence of fracture, dislocation, or joint effusion. There is a large plantar calcaneal spur. Degenerative changes seen along the dorsal aspect of the proximal to mid right foot. Mild anterior and lateral soft tissue swelling is seen. IMPRESSION: Mild soft tissue swelling without acute osseous abnormality. Electronically Signed   By: Aram Candela M.D.   On: 08/07/2021 18:14   ? ?Procedures ?Procedures (including critical care time) ? ?Medications Ordered in UC ?Medications - No data to display ? ?Initial Impression / Assessment and Plan / UC Course  ?I have reviewed the triage vital signs and the nursing notes. ? ?Pertinent labs & imaging results that were available during my care of the patient were reviewed by me and considered in my medical decision making (see chart for details). ? ?  ?Ankle x-ray today does not show acute fracture.  Suspect possible strain to the ligaments around ankle.  We will provide ankle stabilizing brace and encouraged follow-up with foot and ankle center or sports medicine center for rehabilitation.  Can use ice and elevation to help with swelling.  Can also use naproxen 500 mg every 12 hours as needed  for pain.  Encouraged to eat with naproxen.  Seek care if symptoms worsen. ?Final Clinical Impressions(s) / UC Diagnoses  ? ?Final diagnoses:  ?Right ankle swelling  ? ? ? ?Discharge Instructions   ? ?  ?- The ankle x-

## 2021-08-07 NOTE — ED Triage Notes (Signed)
PT reports Rt ankle swelling for 3 weeks. Pt denies injury. ?

## 2021-08-11 DIAGNOSIS — G4733 Obstructive sleep apnea (adult) (pediatric): Secondary | ICD-10-CM | POA: Diagnosis not present

## 2021-08-17 DIAGNOSIS — K5909 Other constipation: Secondary | ICD-10-CM | POA: Diagnosis not present

## 2021-08-17 DIAGNOSIS — Z1211 Encounter for screening for malignant neoplasm of colon: Secondary | ICD-10-CM | POA: Diagnosis not present

## 2021-08-18 ENCOUNTER — Other Ambulatory Visit: Payer: Self-pay | Admitting: Internal Medicine

## 2021-08-18 DIAGNOSIS — Z1231 Encounter for screening mammogram for malignant neoplasm of breast: Secondary | ICD-10-CM

## 2021-08-19 ENCOUNTER — Ambulatory Visit
Admission: RE | Admit: 2021-08-19 | Discharge: 2021-08-19 | Disposition: A | Discharge status: Home / Self Care | Payer: BC Managed Care – PPO | Source: Ambulatory Visit | Attending: Internal Medicine | Admitting: Internal Medicine

## 2021-08-19 DIAGNOSIS — Z1231 Encounter for screening mammogram for malignant neoplasm of breast: Secondary | ICD-10-CM | POA: Diagnosis not present

## 2021-08-26 DIAGNOSIS — H401113 Primary open-angle glaucoma, right eye, severe stage: Secondary | ICD-10-CM | POA: Diagnosis not present

## 2021-08-31 ENCOUNTER — Ambulatory Visit (INDEPENDENT_AMBULATORY_CARE_PROVIDER_SITE_OTHER): Payer: BC Managed Care – PPO

## 2021-08-31 ENCOUNTER — Ambulatory Visit: Payer: BC Managed Care – PPO | Admitting: Podiatry

## 2021-08-31 DIAGNOSIS — M7751 Other enthesopathy of right foot: Secondary | ICD-10-CM | POA: Diagnosis not present

## 2021-08-31 DIAGNOSIS — M79671 Pain in right foot: Secondary | ICD-10-CM

## 2021-08-31 DIAGNOSIS — M722 Plantar fascial fibromatosis: Secondary | ICD-10-CM | POA: Diagnosis not present

## 2021-08-31 DIAGNOSIS — M79672 Pain in left foot: Secondary | ICD-10-CM

## 2021-08-31 MED ORDER — DICLOFENAC SODIUM 1 % EX GEL: 2.0000 g | Freq: Four times a day (QID) | CUTANEOUS | 2 refills | Status: AC

## 2021-08-31 NOTE — Progress Notes (Signed)
SITUATION ?Reason for Consult: Evaluation for Bilateral Custom Foot Orthoses ?Patient / Caregiver Report: Patient is ready for foot orthotics ? ?OBJECTIVE DATA: ?Patient History / Diagnosis:  ?  ICD-10-CM   ?1. Right foot pain  M79.671   ?  ? ? ?Current or Previous Devices:   Historical user ? ?Foot Examination: ?Skin presentation:   Intact ?Ulcers & Callousing:   None ?Toe / Foot Deformities:  Bunion ?Weight Bearing Presentation:  Planus ?Sensation:    Intact ? ?Shoe Size:    44M ? ?ORTHOTIC RECOMMENDATION ?Recommended Device: 1x pair of custom functional foot orthotics ? ?GOALS OF ORTHOSES ?- Reduce Pain ?- Prevent Foot Deformity ?- Prevent Progression of Further Foot Deformity ?- Relieve Pressure ?- Improve the Overall Biomechanical Function of the Foot and Lower Extremity. ? ?ACTIONS PERFORMED ?Potential out of pocket cost was communicated to patient. Patient understood and consent to casting. Patient was casted for Foot Orthoses via crush box. Procedure was explained and patient tolerated procedure well. Casts were shipped to central fabrication. All questions were answered and concerns addressed. ? ?PLAN ?Patient is to be called for fitting when devices are ready.  ? ? ?

## 2021-08-31 NOTE — Patient Instructions (Signed)

## 2021-09-04 DIAGNOSIS — G4733 Obstructive sleep apnea (adult) (pediatric): Secondary | ICD-10-CM | POA: Diagnosis not present

## 2021-09-05 NOTE — Progress Notes (Signed)
Subjective:  ? ?Patient ID: Jillian [name], female   DOB: 56 y.o.   MRN: [id number]  ? ?HPI ?56 year old female presents the office today with concerns of right ankle swelling.  This about 1 year ago she points lateral aspect of the ankle where she notices the swelling discomfort.  She appears to be seen at the emergency room for this and she had x-rays performed.  It hurts more with rotation when she turns over when sleeping.  She feels her feet roll over.  No injuries. ? ? ?Review of Systems  ?All other systems reviewed and are negative. ? ?Past Medical History:  ?Diagnosis Date  ? Elevated cholesterol   ? Glaucoma   ? Hypertension   ? ? ?Past Surgical History:  ?Procedure Laterality Date  ? ABDOMINAL HYSTERECTOMY  2009  ? TAH,BSO  ? CATARACT EXTRACTION    ? EYE SURGERY  05/2010  ? RIGHT  ? HEEL SPUR SURGERY  OCT 2009  ? MULTIPLE MYOMECTOMY  2001  ? OOPHORECTOMY    ? BSO  ? PELVIC LAPAROSCOPY    ? ? ? ?Current Outpatient Medications:  ?  diclofenac Sodium (VOLTAREN) 1 % GEL, Apply 2 g topically 4 (four) times daily. Rub into affected area of foot 2 to 4 times daily, Disp: 100 g, Rfl: 2 ?  acetaminophen (TYLENOL) 500 MG tablet, Take 1,000 mg by mouth daily., Disp: , Rfl:  ?  amLODipine (NORVASC) 10 MG tablet, Take 1 tablet (10 mg total) by mouth daily., Disp: 30 tablet, Rfl: 0 ?  Ascorbic Acid (VITAMIN C) 1000 MG tablet, Take 1,000 mg by mouth daily., Disp: , Rfl:  ?  cetirizine (ZYRTEC ALLERGY) 10 MG tablet, Take 1 tablet (10 mg total) by mouth daily., Disp: 30 tablet, Rfl: 0 ?  diphenhydrAMINE (BENADRYL) 25 MG tablet, Take 1 tablet (25 mg total) by mouth every 6 (six) hours as needed., Disp: 20 tablet, Rfl: 0 ?  EPINEPHrine 0.3 mg/0.3 mL IJ SOAJ injection, Inject 0.3 mg into the muscle as needed for anaphylaxis., Disp: 1 each, Rfl: 0 ?  escitalopram (LEXAPRO) 20 MG tablet, Take 20 mg by mouth daily., Disp: , Rfl:  ?  famotidine (PEPCID) 20 MG tablet, Take 1 tablet (20 mg total) by mouth 2 (two) times daily.,  Disp: 10 tablet, Rfl: 0 ?  Flaxseed, Linseed, (FLAXSEED OIL) 1200 MG CAPS, Take 1,200 mg by mouth daily., Disp: , Rfl:  ?  hydrochlorothiazide (HYDRODIURIL) 25 MG tablet, Take 25 mg by mouth daily., Disp: , Rfl:  ?  latanoprost (XALATAN) 0.005 % ophthalmic solution, Place 1 drop into both eyes at bedtime., Disp: , Rfl:  ?  Multiple Vitamin (MULTIVITAMIN WITH MINERALS) TABS tablet, Take 1 tablet by mouth daily., Disp: , Rfl:  ?  naproxen (NAPROSYN) 500 MG tablet, Take 1 tablet (500 mg total) by mouth 2 (two) times daily. Take with food to prevent GI upset, Disp: 30 tablet, Rfl: 0 ?  Probiotic Product (PROBIOTIC PO), Take 1 tablet by mouth daily., Disp: , Rfl:  ?  rosuvastatin (CRESTOR) 10 MG tablet, Take 10 mg by mouth at bedtime., Disp: , Rfl:  ?  timolol (BETIMOL) 0.5 % ophthalmic solution, 1 drop 2 (two) times daily., Disp: , Rfl:  ?  triamcinolone cream (KENALOG) 0.1 %, Apply 1 application topically 2 (two) times daily., Disp: 30 g, Rfl: 0 ?  VAGIFEM 10 MCG TABS vaginal tablet, Place one tablet (10 mcg) per vagina every night before bed for two weeks when you first  begin this medicine, then after the first two weeks, begin using it twice a week., Disp: 34 tablet, Rfl: 3 ? ?Allergies  ?Allergen Reactions  ? Latex Rash  ? Ace Inhibitors Swelling  ? Adhesive [Tape]   ? ? ? ? ? ?   ?Objective:  ?Physical Exam  ?General: AAO x3, NAD ? ?Dermatological: Skin is warm, dry and supple bilateral. There are no open sores, no preulcerative lesions, no rash or signs of infection present. ? ?Vascular: Dorsalis Pedis artery and Posterior Tibial artery pedal pulses are 2/4 bilateral with immedate capillary fill time.  There is no pain with calf compression, swelling, warmth, erythema.  ? ?Neruologic: Grossly intact via light touch bilateral.  ? ?Musculoskeletal: Decreased medial arch upon weightbearing.  Planus present.  There is palpation on the plantar aspect of the calcaneal insertion of plantar fascia as well as on the  lateral aspect of the ankle in the course of the peroneal tendons.  No area pinpoint tenderness.  Minimal edema there is no erythema or warmth.  Muscular strength 5/5 in all groups tested bilateral. ? ?Gait: Unassisted, Nonantalgic.  ? ? ? ?   ?Assessment:  ? ?Tendinitis right ankle ? ?   ?Plan:  ?-Treatment options discussed including all alternatives, risks, and complications ?-Etiology of symptoms were discussed ?-X-rays were obtained and reviewed with the patient.  3 views of the right ankle are obtained.  No evidence of acute fracture.  Calcaneal spurring is present. ?-Discussion modifications and arch supports.  She is seen today by our orthotist, Arlys John for inserts. ?-Voltaren gel. ?-Stretching, icing daily. ? ?Vivi Barrack DPM ? ?   ? ?

## 2021-09-13 DIAGNOSIS — Z1211 Encounter for screening for malignant neoplasm of colon: Secondary | ICD-10-CM | POA: Diagnosis not present

## 2021-09-14 ENCOUNTER — Other Ambulatory Visit: Payer: Self-pay | Admitting: Podiatry

## 2021-09-14 DIAGNOSIS — M7751 Other enthesopathy of right foot: Secondary | ICD-10-CM

## 2021-09-23 ENCOUNTER — Ambulatory Visit: Payer: BC Managed Care – PPO

## 2021-09-23 DIAGNOSIS — M722 Plantar fascial fibromatosis: Secondary | ICD-10-CM

## 2021-09-23 NOTE — Progress Notes (Signed)

## 2021-09-27 ENCOUNTER — Other Ambulatory Visit: Payer: Self-pay | Admitting: Nurse Practitioner

## 2021-09-27 DIAGNOSIS — K529 Noninfective gastroenteritis and colitis, unspecified: Secondary | ICD-10-CM | POA: Diagnosis not present

## 2021-09-29 NOTE — Telephone Encounter (Signed)
Change in provider-no longer under provider cre ?Requested Prescriptions  ?Pending Prescriptions Disp Refills  ?? naproxen (NAPROSYN) 500 MG tablet [Pharmacy Med Name: Naproxen 500 MG Oral Tablet] 30 tablet 0  ?  Sig: TAKE 1 TABLET BY MOUTH TWICE DAILY TAKE  WITH  FOOD  TO  PREVENT  GI  UPSET  ?  ? Analgesics:  NSAIDS Failed - 09/27/2021  5:41 PM  ?  ?  Failed - Manual Review: Labs are only required if the patient has taken medication for more than 8 weeks.  ?  ?  Failed - Valid encounter within last 12 months  ?  Recent Outpatient Visits   ?      ? 6 years ago Other fatigue  ? Primary Care at [facility], Audrie Lia, PA-C  ?  ?  ? ?  ?  ?  Passed - Cr in normal range and within 360 days  ?  Creat  ?Date Value Ref Range Status  ?01/14/2019 0.89 0.50 - 1.05 mg/dL Final  ?  Comment:  ?  For patients >86 years of age, the reference limit ?for Creatinine is approximately 13% higher for people ?identified as African-American. ?. ?  ? ?Creatinine, Ser  ?Date Value Ref Range Status  ?12/01/2020 0.93 0.44 - 1.00 mg/dL Final  ?   ?  ?  Passed - HGB in normal range and within 360 days  ?  Hemoglobin  ?Date Value Ref Range Status  ?12/01/2020 12.3 12.0 - 15.0 g/dL Final  ?   ?  ?  Passed - PLT in normal range and within 360 days  ?  Platelets  ?Date Value Ref Range Status  ?12/01/2020 234 150 - 400 K/uL Final  ? ?Platelet Count, POC  ?Date Value Ref Range Status  ?07/16/2015 151 142 - 424 K/uL Final  ?   ?  ?  Passed - HCT in normal range and within 360 days  ?  HCT  ?Date Value Ref Range Status  ?12/01/2020 38.4 36.0 - 46.0 % Final  ?   ?  ?  Passed - eGFR is 30 or above and within 360 days  ?  GFR, Estimated  ?Date Value Ref Range Status  ?12/01/2020 >60 >60 mL/min Final  ?  Comment:  ?  (NOTE) ?Calculated using the CKD-EPI Creatinine Equation (2021) ?  ?   ?  ?  Passed - Patient is not pregnant  ?  ?  ? ?

## 2021-10-04 DIAGNOSIS — G4733 Obstructive sleep apnea (adult) (pediatric): Secondary | ICD-10-CM | POA: Diagnosis not present

## 2021-10-18 DIAGNOSIS — M25569 Pain in unspecified knee: Secondary | ICD-10-CM | POA: Diagnosis not present

## 2021-10-26 DIAGNOSIS — K219 Gastro-esophageal reflux disease without esophagitis: Secondary | ICD-10-CM | POA: Diagnosis not present

## 2021-10-26 DIAGNOSIS — Z1211 Encounter for screening for malignant neoplasm of colon: Secondary | ICD-10-CM | POA: Diagnosis not present

## 2021-10-26 DIAGNOSIS — K529 Noninfective gastroenteritis and colitis, unspecified: Secondary | ICD-10-CM | POA: Diagnosis not present

## 2021-11-04 DIAGNOSIS — G4733 Obstructive sleep apnea (adult) (pediatric): Secondary | ICD-10-CM | POA: Diagnosis not present

## 2021-12-04 DIAGNOSIS — G4733 Obstructive sleep apnea (adult) (pediatric): Secondary | ICD-10-CM | POA: Diagnosis not present

## 2021-12-24 DIAGNOSIS — H401121 Primary open-angle glaucoma, left eye, mild stage: Secondary | ICD-10-CM | POA: Diagnosis not present

## 2021-12-24 DIAGNOSIS — Z961 Presence of intraocular lens: Secondary | ICD-10-CM | POA: Diagnosis not present

## 2021-12-24 DIAGNOSIS — H2512 Age-related nuclear cataract, left eye: Secondary | ICD-10-CM | POA: Diagnosis not present

## 2021-12-24 DIAGNOSIS — H401113 Primary open-angle glaucoma, right eye, severe stage: Secondary | ICD-10-CM | POA: Diagnosis not present

## 2021-12-31 DIAGNOSIS — K219 Gastro-esophageal reflux disease without esophagitis: Secondary | ICD-10-CM | POA: Diagnosis not present

## 2022-01-03 DIAGNOSIS — Z1211 Encounter for screening for malignant neoplasm of colon: Secondary | ICD-10-CM | POA: Diagnosis not present

## 2022-01-03 DIAGNOSIS — K219 Gastro-esophageal reflux disease without esophagitis: Secondary | ICD-10-CM | POA: Diagnosis not present

## 2022-01-16 DIAGNOSIS — K200 Eosinophilic esophagitis: Secondary | ICD-10-CM | POA: Diagnosis not present

## 2022-01-19 DIAGNOSIS — G4733 Obstructive sleep apnea (adult) (pediatric): Secondary | ICD-10-CM | POA: Diagnosis not present

## 2022-01-25 NOTE — Progress Notes (Deleted)
56 y.o. G1P0010 Married [place] American female here for annual exam.    Patient's husband passed last October complications of diabetes.  PCP:  Renford Dills, MD  Patient's last menstrual period was 09/14/2007.           Sexually active: No.  The current method of family planning is status post hysterectomy.    Exercising: No.  The patient does not participate in regular exercise at present. Smoker:  no  Health Maintenance: Pap:   12-12-16 Neg, 07-09-14 Neg History of abnormal Pap:  no MMG:  08-19-21 Neg/BiRads1 Colonoscopy:  2023 normal BMD:  2011  Result  Normal TDaP:  01-19-21 Gardasil:   no HIV: no Hep C: no Screening Labs:  Hb today: ***, Urine today: ***   reports that she quit smoking about 30 years ago. Her smoking use included cigarettes. She has never used smokeless tobacco. She reports that she does not currently use alcohol. She reports that she does not use drugs.  Past Medical History:  Diagnosis Date   Elevated cholesterol    Glaucoma    Hypertension     Past Surgical History:  Procedure Laterality Date   ABDOMINAL HYSTERECTOMY  2009   TAH,BSO   CATARACT EXTRACTION     EYE SURGERY  05/2010   RIGHT   HEEL SPUR SURGERY  OCT 2009   MULTIPLE MYOMECTOMY  2001   OOPHORECTOMY     BSO   PELVIC LAPAROSCOPY      Current Outpatient Medications  Medication Sig Dispense Refill   acetaminophen (TYLENOL) 500 MG tablet Take 1,000 mg by mouth daily.     amLODipine (NORVASC) 10 MG tablet Take 1 tablet (10 mg total) by mouth daily. 30 tablet 0   Ascorbic Acid (VITAMIN C) 1000 MG tablet Take 1,000 mg by mouth daily.     cetirizine (ZYRTEC ALLERGY) 10 MG tablet Take 1 tablet (10 mg total) by mouth daily. 30 tablet 0   diclofenac Sodium (VOLTAREN) 1 % GEL Apply 2 g topically 4 (four) times daily. Rub into affected area of foot 2 to 4 times daily 100 g 2   dorzolamide-timolol (COSOPT) 22.3-6.8 MG/ML ophthalmic solution 1 drop 2 (two) times daily.     EPINEPHrine 0.3 mg/0.3  mL IJ SOAJ injection Inject 0.3 mg into the muscle as needed for anaphylaxis. 1 each 0   escitalopram (LEXAPRO) 20 MG tablet Take 20 mg by mouth daily.     famotidine (PEPCID) 20 MG tablet Take 1 tablet (20 mg total) by mouth 2 (two) times daily. 10 tablet 0   hydrochlorothiazide (HYDRODIURIL) 25 MG tablet Take 25 mg by mouth daily.     latanoprost (XALATAN) 0.005 % ophthalmic solution Place 1 drop into both eyes at bedtime.     meloxicam (MOBIC) 15 MG tablet Take 15 mg by mouth daily.     Multiple Vitamin (MULTIVITAMIN WITH MINERALS) TABS tablet Take 1 tablet by mouth daily.     rosuvastatin (CRESTOR) 10 MG tablet Take 10 mg by mouth at bedtime.     timolol (BETIMOL) 0.5 % ophthalmic solution 1 drop 2 (two) times daily.     triamcinolone cream (KENALOG) 0.1 % Apply 1 application topically 2 (two) times daily. 30 g 0   VAGIFEM 10 MCG TABS vaginal tablet Place one tablet (10 mcg) per vagina every night before bed for two weeks when you first begin this medicine, then after the first two weeks, begin using it twice a week. 34 tablet 3   WEGOVY  1 MG/0.5ML SOAJ SMARTSIG:0.5 Milliliter(s) SUB-Q Once a Week     No current facility-administered medications for this visit.    Family History  Problem Relation Age of Onset   Hypertension Mother    Heart disease Mother    Diabetes Maternal Uncle    Cancer Maternal Uncle        ? type   Breast cancer Neg Hx     Review of Systems  All other systems reviewed and are negative.   Exam:   BP 108/64   Pulse 64   Ht 5\' 4"  (1.626 m)   Wt 200 lb (90.7 kg)   LMP 09/14/2007   SpO2 98%   BMI 34.33 kg/m     General appearance: alert, cooperative and appears stated age Head: normocephalic, without obvious abnormality, atraumatic Neck: no adenopathy, supple, symmetrical, trachea midline and thyroid normal to inspection and palpation Lungs: clear to auscultation bilaterally Breasts: normal appearance, no masses or tenderness, No nipple retraction or  dimpling, No nipple discharge or bleeding, No axillary adenopathy Heart: regular rate and rhythm Abdomen: soft, non-tender; no masses, no organomegaly Extremities: extremities normal, atraumatic, no cyanosis or edema Skin: skin color, texture, turgor normal. No rashes or lesions Lymph nodes: cervical, supraclavicular, and axillary nodes normal. Neurologic: grossly normal  Pelvic: External genitalia:  no lesions              No abnormal inguinal nodes palpated.              Urethra:  normal appearing urethra with no masses, tenderness or lesions              Bartholins and Skenes: normal                 Vagina: normal appearing vagina with normal color and discharge, no lesions              Cervix: no lesions              Pap taken: {yes no:314532} Bimanual Exam:  Uterus:  normal size, contour, position, consistency, mobility, non-tender              Adnexa: no mass, fullness, tenderness              Rectal exam: {yes no:314532}.  Confirms.              Anus:  normal sphincter tone, no lesions  Chaperone was present for exam:  ***  Assessment:   Well woman visit with gynecologic exam.   Plan: Mammogram screening discussed. Self breast awareness reviewed. Pap and HR HPV as above. Guidelines for Calcium, Vitamin D, regular exercise program including cardiovascular and weight bearing exercise.   Follow up annually and prn.   Additional counseling given.  {yes 11/14/2007. _______ minutes face to face time of which over 50% was spent in counseling.    After visit summary provided.

## 2022-01-27 ENCOUNTER — Encounter: Payer: Self-pay | Admitting: Obstetrics and Gynecology

## 2022-01-27 ENCOUNTER — Ambulatory Visit (INDEPENDENT_AMBULATORY_CARE_PROVIDER_SITE_OTHER): Payer: BC Managed Care – PPO | Admitting: Obstetrics and Gynecology

## 2022-01-27 VITALS — BP 108/64 | HR 64 | Ht 64.0 in | Wt 200.0 lb

## 2022-01-27 DIAGNOSIS — R5383 Other fatigue: Secondary | ICD-10-CM | POA: Diagnosis not present

## 2022-01-27 DIAGNOSIS — Z01419 Encounter for gynecological examination (general) (routine) without abnormal findings: Secondary | ICD-10-CM

## 2022-01-27 DIAGNOSIS — Z0000 Encounter for general adult medical examination without abnormal findings: Secondary | ICD-10-CM | POA: Diagnosis not present

## 2022-01-27 DIAGNOSIS — E785 Hyperlipidemia, unspecified: Secondary | ICD-10-CM | POA: Diagnosis not present

## 2022-01-27 DIAGNOSIS — I10 Essential (primary) hypertension: Secondary | ICD-10-CM | POA: Diagnosis not present

## 2022-01-27 MED ORDER — ESTRADIOL 10 MCG VA TABS: ORAL_TABLET | VAGINAL | 3 refills | Status: DC

## 2022-01-27 NOTE — Patient Instructions (Signed)

## 2022-01-27 NOTE — Progress Notes (Signed)
56 y.o. G49P0010 Married [place] American female here for annual exam.    Trying to loose weight.   Feeling tired.  Could sleep all day.  Taking Lexapro.  Using CPAP.   Wants to try Vagifem.   She has not really used this.  Patient's husband passed last October complications of diabetes. Has a good support system.   Helping to care for her mother who has dementia.   PCP:  Renford Dills, MD  Patient's last menstrual period was 09/14/2007.           Sexually active: No.  The current method of family planning is status post hysterectomy.    Exercising: No.  The patient does not participate in regular exercise at present. Smoker:  no  Health Maintenance: Pap:   12-12-16 Neg, 07-09-14 Neg History of abnormal Pap:  no MMG:  08-19-21 Neg/BiRads1 Colonoscopy:  2023 normal.  Told she has inflammation. [facility]  BMD:  2011  Result  Normal TDaP:  01-19-21 Gardasil:   no HIV: no Hep C: no Screening Labs:  Today.    reports that she quit smoking about 30 years ago. Her smoking use included cigarettes. She has never used smokeless tobacco. She reports that she does not currently use alcohol. She reports that she does not use drugs.  Past Medical History:  Diagnosis Date   Elevated cholesterol    Glaucoma    Hypertension    Sleep apnea    Using CPAP    Past Surgical History:  Procedure Laterality Date   ABDOMINAL HYSTERECTOMY  2009   TAH,BSO   CATARACT EXTRACTION     EYE SURGERY  05/2010   RIGHT   HEEL SPUR SURGERY  OCT 2009   MULTIPLE MYOMECTOMY  2001   OOPHORECTOMY     BSO   PELVIC LAPAROSCOPY      Current Outpatient Medications  Medication Sig Dispense Refill   acetaminophen (TYLENOL) 500 MG tablet Take 1,000 mg by mouth daily.     amLODipine (NORVASC) 10 MG tablet Take 1 tablet (10 mg total) by mouth daily. 30 tablet 0   Ascorbic Acid (VITAMIN C) 1000 MG tablet Take 1,000 mg by mouth daily.     cetirizine (ZYRTEC ALLERGY) 10 MG tablet Take 1 tablet (10 mg  total) by mouth daily. 30 tablet 0   diclofenac Sodium (VOLTAREN) 1 % GEL Apply 2 g topically 4 (four) times daily. Rub into affected area of foot 2 to 4 times daily 100 g 2   dorzolamide-timolol (COSOPT) 22.3-6.8 MG/ML ophthalmic solution 1 drop 2 (two) times daily.     EPINEPHrine 0.3 mg/0.3 mL IJ SOAJ injection Inject 0.3 mg into the muscle as needed for anaphylaxis. 1 each 0   escitalopram (LEXAPRO) 20 MG tablet Take 20 mg by mouth daily.     famotidine (PEPCID) 20 MG tablet Take 1 tablet (20 mg total) by mouth 2 (two) times daily. 10 tablet 0   hydrochlorothiazide (HYDRODIURIL) 25 MG tablet Take 25 mg by mouth daily.     latanoprost (XALATAN) 0.005 % ophthalmic solution Place 1 drop into both eyes at bedtime.     meloxicam (MOBIC) 15 MG tablet Take 15 mg by mouth daily.     Multiple Vitamin (MULTIVITAMIN WITH MINERALS) TABS tablet Take 1 tablet by mouth daily.     rosuvastatin (CRESTOR) 10 MG tablet Take 10 mg by mouth at bedtime.     timolol (BETIMOL) 0.5 % ophthalmic solution 1 drop 2 (two) times daily.  triamcinolone cream (KENALOG) 0.1 % Apply 1 application topically 2 (two) times daily. 30 g 0   VAGIFEM 10 MCG TABS vaginal tablet Place one tablet (10 mcg) per vagina every night before bed for two weeks when you first begin this medicine, then after the first two weeks, begin using it twice a week. 34 tablet 3   WEGOVY 1 MG/0.5ML SOAJ SMARTSIG:0.5 Milliliter(s) SUB-Q Once a Week     No current facility-administered medications for this visit.    Family History  Problem Relation Age of Onset   Hypertension Mother    Heart disease Mother    Diabetes Maternal Uncle    Cancer Maternal Uncle        ? type   Breast cancer Neg Hx     Review of Systems  All other systems reviewed and are negative.   Exam:   BP 108/64   Pulse 64   Ht 5\' 4"  (1.626 m)   Wt 200 lb (90.7 kg)   LMP 09/14/2007   SpO2 98%   BMI 34.33 kg/m     General appearance: alert, cooperative and appears  stated age Head: normocephalic, without obvious abnormality, atraumatic Neck: no adenopathy, supple, symmetrical, trachea midline and thyroid normal to inspection and palpation Lungs: clear to auscultation bilaterally Breasts: normal appearance, no masses or tenderness, No nipple retraction or dimpling, No nipple discharge or bleeding, No axillary adenopathy Heart: regular rate and rhythm Abdomen: soft, non-tender; no masses, no organomegaly Extremities: extremities normal, atraumatic, no cyanosis or edema Skin: skin color, texture, turgor normal. No rashes or lesions Lymph nodes: cervical, supraclavicular, and axillary nodes normal. Neurologic: grossly normal  Pelvic: External genitalia:  no lesions              No abnormal inguinal nodes palpated.              Urethra:  normal appearing urethra with no masses, tenderness or lesions              Bartholins and Skenes: normal                 Vagina: atrophy noted.  No lesions.              Cervix: absent              Pap taken: no Bimanual Exam:  Uterus:  absent              Adnexa: no mass, fullness, tenderness              Rectal exam: yes.  Confirms.              Anus:  normal sphincter tone, no lesions  Chaperone was present for exam:  11/14/2007, CMA  Assessment:   Well woman visit with gynecologic exam. Status post TAH/BSO - fibroid. Atrophy.  Fatigue. Sleep apnea.  On CPAP. Bereavement.   Plan: Mammogram screening discussed. Self breast awareness reviewed. Pap and HR HPV as above. Guidelines for Calcium, Vitamin D, regular exercise program including cardiovascular and weight bearing exercise. Refill of Vagifem.  Instructed in use.  I discussed potential effect on breast cancer.  Will check CBC, TSH, vit B12.  If labs are normal, I already recommended to her that she needs to see her PCP.   Support given.  Follow up annually and prn.   After visit summary provided.

## 2022-01-28 DIAGNOSIS — G4733 Obstructive sleep apnea (adult) (pediatric): Secondary | ICD-10-CM | POA: Diagnosis not present

## 2022-01-28 LAB — CBC
HCT: 38.4 % (ref 35.0–45.0)
Hemoglobin: 12.8 g/dL (ref 11.7–15.5)
MCH: 28.8 pg (ref 27.0–33.0)
MCHC: 33.3 g/dL (ref 32.0–36.0)
MCV: 86.3 fL (ref 80.0–100.0)
MPV: 11.2 fL (ref 7.5–12.5)
Platelets: 225 10*3/uL (ref 140–400)
RBC: 4.45 10*6/uL (ref 3.80–5.10)
RDW: 13.6 % (ref 11.0–15.0)
WBC: 5.2 10*3/uL (ref 3.8–10.8)

## 2022-01-28 LAB — VITAMIN B12: Vitamin B-12: 1387 pg/mL — ABNORMAL HIGH (ref 200–1100)

## 2022-01-28 LAB — TSH: TSH: 0.65 mIU/L (ref 0.40–4.50)

## 2022-02-01 DIAGNOSIS — K30 Functional dyspepsia: Secondary | ICD-10-CM | POA: Diagnosis not present

## 2022-02-01 DIAGNOSIS — K5909 Other constipation: Secondary | ICD-10-CM | POA: Diagnosis not present

## 2022-02-22 DIAGNOSIS — Z0000 Encounter for general adult medical examination without abnormal findings: Secondary | ICD-10-CM | POA: Diagnosis not present

## 2022-02-22 DIAGNOSIS — I10 Essential (primary) hypertension: Secondary | ICD-10-CM | POA: Diagnosis not present

## 2022-02-22 DIAGNOSIS — G4733 Obstructive sleep apnea (adult) (pediatric): Secondary | ICD-10-CM | POA: Diagnosis not present

## 2022-02-22 DIAGNOSIS — F331 Major depressive disorder, recurrent, moderate: Secondary | ICD-10-CM | POA: Diagnosis not present

## 2022-02-22 DIAGNOSIS — E7800 Pure hypercholesterolemia, unspecified: Secondary | ICD-10-CM | POA: Diagnosis not present

## 2022-03-18 DIAGNOSIS — D649 Anemia, unspecified: Secondary | ICD-10-CM | POA: Diagnosis not present

## 2022-04-05 DIAGNOSIS — D649 Anemia, unspecified: Secondary | ICD-10-CM | POA: Diagnosis not present

## 2022-05-03 DIAGNOSIS — H401113 Primary open-angle glaucoma, right eye, severe stage: Secondary | ICD-10-CM | POA: Diagnosis not present

## 2022-05-03 DIAGNOSIS — H2512 Age-related nuclear cataract, left eye: Secondary | ICD-10-CM | POA: Diagnosis not present

## 2022-05-03 DIAGNOSIS — Z961 Presence of intraocular lens: Secondary | ICD-10-CM | POA: Diagnosis not present

## 2022-05-03 DIAGNOSIS — H401121 Primary open-angle glaucoma, left eye, mild stage: Secondary | ICD-10-CM | POA: Diagnosis not present

## 2022-05-04 DIAGNOSIS — K5909 Other constipation: Secondary | ICD-10-CM | POA: Diagnosis not present

## 2022-05-04 DIAGNOSIS — K30 Functional dyspepsia: Secondary | ICD-10-CM | POA: Diagnosis not present

## 2022-07-20 DIAGNOSIS — M65311 Trigger thumb, right thumb: Secondary | ICD-10-CM | POA: Diagnosis not present

## 2022-07-20 DIAGNOSIS — M25511 Pain in right shoulder: Secondary | ICD-10-CM | POA: Diagnosis not present

## 2022-07-20 DIAGNOSIS — M25512 Pain in left shoulder: Secondary | ICD-10-CM | POA: Diagnosis not present

## 2022-08-17 DIAGNOSIS — M65311 Trigger thumb, right thumb: Secondary | ICD-10-CM | POA: Diagnosis not present

## 2022-08-29 ENCOUNTER — Encounter: Payer: Self-pay | Admitting: *Deleted

## 2022-10-05 DIAGNOSIS — Z961 Presence of intraocular lens: Secondary | ICD-10-CM | POA: Diagnosis not present

## 2022-10-05 DIAGNOSIS — H2512 Age-related nuclear cataract, left eye: Secondary | ICD-10-CM | POA: Diagnosis not present

## 2022-10-05 DIAGNOSIS — H401121 Primary open-angle glaucoma, left eye, mild stage: Secondary | ICD-10-CM | POA: Diagnosis not present

## 2022-10-05 DIAGNOSIS — H401113 Primary open-angle glaucoma, right eye, severe stage: Secondary | ICD-10-CM | POA: Diagnosis not present

## 2022-10-12 DIAGNOSIS — M25511 Pain in right shoulder: Secondary | ICD-10-CM | POA: Diagnosis not present

## 2022-10-12 DIAGNOSIS — M25512 Pain in left shoulder: Secondary | ICD-10-CM | POA: Diagnosis not present

## 2022-10-21 ENCOUNTER — Other Ambulatory Visit: Payer: Self-pay | Admitting: Internal Medicine

## 2022-10-21 ENCOUNTER — Ambulatory Visit
Admission: RE | Admit: 2022-10-21 | Discharge: 2022-10-21 | Disposition: A | Discharge status: Home / Self Care | Payer: BC Managed Care – PPO | Source: Ambulatory Visit | Attending: Internal Medicine | Admitting: Internal Medicine

## 2022-10-21 DIAGNOSIS — Z0000 Encounter for general adult medical examination without abnormal findings: Secondary | ICD-10-CM

## 2022-10-21 DIAGNOSIS — Z1231 Encounter for screening mammogram for malignant neoplasm of breast: Secondary | ICD-10-CM | POA: Diagnosis not present

## 2022-11-10 DIAGNOSIS — M25572 Pain in left ankle and joints of left foot: Secondary | ICD-10-CM | POA: Diagnosis not present

## 2023-01-02 DIAGNOSIS — Z961 Presence of intraocular lens: Secondary | ICD-10-CM | POA: Diagnosis not present

## 2023-01-02 DIAGNOSIS — H2512 Age-related nuclear cataract, left eye: Secondary | ICD-10-CM | POA: Diagnosis not present

## 2023-01-02 DIAGNOSIS — H401113 Primary open-angle glaucoma, right eye, severe stage: Secondary | ICD-10-CM | POA: Diagnosis not present

## 2023-01-02 DIAGNOSIS — H401121 Primary open-angle glaucoma, left eye, mild stage: Secondary | ICD-10-CM | POA: Diagnosis not present

## 2023-01-18 NOTE — Progress Notes (Deleted)
57 y.o. G38P0010 Married [place] American female here for annual exam.    PCP:     Patient's last menstrual period was 09/14/2007.           Sexually active: {yes no:314532}  The current method of family planning is status post hysterectomy.    Exercising: {yes no:314532}  {types:19826} Smoker:  former  Health Maintenance: Pap:  12-12-16 Neg, 07-09-14 Neg  History of abnormal Pap:  no MMG:  10/21/22 Breast Density Cat C, BI-RADS CAT 1 neg Colonoscopy:  2023 normal BMD:   2011  Result  normal TDaP:  01/19/21 Gardasil:   no HIV: n/a Hep C: n/a Screening Labs:  Hb today: ***, Urine today: ***   reports that she quit smoking about 31 years ago. Her smoking use included cigarettes. She has never used smokeless tobacco. She reports that she does not currently use alcohol. She reports that she does not use drugs.  Past Medical History:  Diagnosis Date   Elevated cholesterol    Glaucoma    Hypertension    Sleep apnea    Using CPAP    Past Surgical History:  Procedure Laterality Date   ABDOMINAL HYSTERECTOMY  2009   TAH,BSO   CATARACT EXTRACTION     EYE SURGERY  05/2010   RIGHT   HEEL SPUR SURGERY  OCT 2009   MULTIPLE MYOMECTOMY  2001   OOPHORECTOMY     BSO   PELVIC LAPAROSCOPY      Current Outpatient Medications  Medication Sig Dispense Refill   acetaminophen (TYLENOL) 500 MG tablet Take 1,000 mg by mouth daily.     amLODipine (NORVASC) 10 MG tablet Take 1 tablet (10 mg total) by mouth daily. 30 tablet 0   Ascorbic Acid (VITAMIN C) 1000 MG tablet Take 1,000 mg by mouth daily.     cetirizine (ZYRTEC ALLERGY) 10 MG tablet Take 1 tablet (10 mg total) by mouth daily. 30 tablet 0   diclofenac Sodium (VOLTAREN) 1 % GEL Apply 2 g topically 4 (four) times daily. Rub into affected area of foot 2 to 4 times daily 100 g 2   dorzolamide-timolol (COSOPT) 22.3-6.8 MG/ML ophthalmic solution 1 drop 2 (two) times daily.     EPINEPHrine 0.3 mg/0.3 mL IJ SOAJ injection Inject 0.3 mg into the  muscle as needed for anaphylaxis. 1 each 0   escitalopram (LEXAPRO) 20 MG tablet Take 20 mg by mouth daily.     Estradiol (VAGIFEM) 10 MCG TABS vaginal tablet Place one tablet (10 mcg) per vagina every night before bed for two weeks when you first begin this medicine, then after the first two weeks, begin using it twice a week. 34 tablet 3   famotidine (PEPCID) 20 MG tablet Take 1 tablet (20 mg total) by mouth 2 (two) times daily. 10 tablet 0   hydrochlorothiazide (HYDRODIURIL) 25 MG tablet Take 25 mg by mouth daily.     latanoprost (XALATAN) 0.005 % ophthalmic solution Place 1 drop into both eyes at bedtime.     meloxicam (MOBIC) 15 MG tablet Take 15 mg by mouth daily.     Multiple Vitamin (MULTIVITAMIN WITH MINERALS) TABS tablet Take 1 tablet by mouth daily.     rosuvastatin (CRESTOR) 10 MG tablet Take 10 mg by mouth at bedtime.     timolol (BETIMOL) 0.5 % ophthalmic solution 1 drop 2 (two) times daily.     triamcinolone cream (KENALOG) 0.1 % Apply 1 application topically 2 (two) times daily. 30 g 0  WEGOVY 1 MG/0.5ML SOAJ SMARTSIG:0.5 Milliliter(s) SUB-Q Once a Week     No current facility-administered medications for this visit.    Family History  Problem Relation Age of Onset   Hypertension Mother    Heart disease Mother    Diabetes Maternal Uncle    Cancer Maternal Uncle        ? type   Breast cancer Neg Hx     Review of Systems  Exam:   LMP 09/14/2007     General appearance: alert, cooperative and appears stated age Head: normocephalic, without obvious abnormality, atraumatic Neck: no adenopathy, supple, symmetrical, trachea midline and thyroid normal to inspection and palpation Lungs: clear to auscultation bilaterally Breasts: normal appearance, no masses or tenderness, No nipple retraction or dimpling, No nipple discharge or bleeding, No axillary adenopathy Heart: regular rate and rhythm Abdomen: soft, non-tender; no masses, no organomegaly Extremities: extremities  normal, atraumatic, no cyanosis or edema Skin: skin color, texture, turgor normal. No rashes or lesions Lymph nodes: cervical, supraclavicular, and axillary nodes normal. Neurologic: grossly normal  Pelvic: External genitalia:  no lesions              No abnormal inguinal nodes palpated.              Urethra:  normal appearing urethra with no masses, tenderness or lesions              Bartholins and Skenes: normal                 Vagina: normal appearing vagina with normal color and discharge, no lesions              Cervix: no lesions              Pap taken: {yes no:314532} Bimanual Exam:  Uterus:  normal size, contour, position, consistency, mobility, non-tender              Adnexa: no mass, fullness, tenderness              Rectal exam: {yes no:314532}.  Confirms.              Anus:  normal sphincter tone, no lesions  Chaperone was present for exam:  ***  Assessment:   Well woman visit with gynecologic exam.   Plan: Mammogram screening discussed. Self breast awareness reviewed. Pap and HR HPV as above. Guidelines for Calcium, Vitamin D, regular exercise program including cardiovascular and weight bearing exercise.   Follow up annually and prn.   Additional counseling given.  {yes [id number]. _______ minutes face to face time of which over 50% was spent in counseling.    After visit summary provided.

## 2023-02-01 ENCOUNTER — Ambulatory Visit: Payer: BC Managed Care – PPO | Admitting: Obstetrics and Gynecology

## 2023-05-12 NOTE — Progress Notes (Deleted)
 56 y.o. G42P0010 Married [place] American female here for annual exam.    PCP: Renford Dills, MD   Patient's last menstrual period was 09/14/2007.           Sexually active: No.  The current method of family planning is status post hysterectomy.    Menopausal hormone therapy:  vagifem Exercising: {yes no:314532}  {types:19826} Smoker:  no  OB History  Gravida Para Term Preterm AB Living  1    1 0  SAB IAB Ectopic Multiple Live Births  1        # Outcome Date GA Lbr Len/2nd Weight Sex Type Anes PTL Lv  1 SAB              HEALTH MAINTENANCE: Last 2 paps:  12/12/16 neg, 07/09/14 neg History of abnormal Pap or positive HPV:  no Mammogram:   10/21/22 Breast Density Cat C, BI-RADS CAT 1 neg Colonoscopy:  2023 normal. Told she has inflammation. [facility].  Bone Density:  10/07/09  Result  WNL   Immunization History  Administered Date(s) Administered   Influenza Inj Mdck Quad Pf 03/25/2017   Influenza,inj,Quad PF,6+ Mos 02/02/2019      reports that she quit smoking about 32 years ago. Her smoking use included cigarettes. She has never used smokeless tobacco. She reports that she does not currently use alcohol. She reports that she does not use drugs.  Past Medical History:  Diagnosis Date   Elevated cholesterol    Glaucoma    Hypertension    Sleep apnea    Using CPAP    Past Surgical History:  Procedure Laterality Date   ABDOMINAL HYSTERECTOMY  2009   TAH,BSO   CATARACT EXTRACTION     EYE SURGERY  05/2010   RIGHT   HEEL SPUR SURGERY  OCT 2009   MULTIPLE MYOMECTOMY  2001   OOPHORECTOMY     BSO   PELVIC LAPAROSCOPY      Current Outpatient Medications  Medication Sig Dispense Refill   acetaminophen (TYLENOL) 500 MG tablet Take 1,000 mg by mouth daily.     amLODipine (NORVASC) 10 MG tablet Take 1 tablet (10 mg total) by mouth daily. 30 tablet 0   Ascorbic Acid (VITAMIN C) 1000 MG tablet Take 1,000 mg by mouth daily.     cetirizine (ZYRTEC ALLERGY) 10 MG  tablet Take 1 tablet (10 mg total) by mouth daily. 30 tablet 0   diclofenac Sodium (VOLTAREN) 1 % GEL Apply 2 g topically 4 (four) times daily. Rub into affected area of foot 2 to 4 times daily 100 g 2   dorzolamide-timolol (COSOPT) 22.3-6.8 MG/ML ophthalmic solution 1 drop 2 (two) times daily.     EPINEPHrine 0.3 mg/0.3 mL IJ SOAJ injection Inject 0.3 mg into the muscle as needed for anaphylaxis. 1 each 0   escitalopram (LEXAPRO) 20 MG tablet Take 20 mg by mouth daily.     Estradiol (VAGIFEM) 10 MCG TABS vaginal tablet Place one tablet (10 mcg) per vagina every night before bed for two weeks when you first begin this medicine, then after the first two weeks, begin using it twice a week. 34 tablet 3   famotidine (PEPCID) 20 MG tablet Take 1 tablet (20 mg total) by mouth 2 (two) times daily. 10 tablet 0   hydrochlorothiazide (HYDRODIURIL) 25 MG tablet Take 25 mg by mouth daily.     latanoprost (XALATAN) 0.005 % ophthalmic solution Place 1 drop into both eyes at bedtime.  meloxicam (MOBIC) 15 MG tablet Take 15 mg by mouth daily.     Multiple Vitamin (MULTIVITAMIN WITH MINERALS) TABS tablet Take 1 tablet by mouth daily.     rosuvastatin (CRESTOR) 10 MG tablet Take 10 mg by mouth at bedtime.     timolol (BETIMOL) 0.5 % ophthalmic solution 1 drop 2 (two) times daily.     triamcinolone cream (KENALOG) 0.1 % Apply 1 application topically 2 (two) times daily. 30 g 0   WEGOVY 1 MG/0.5ML SOAJ SMARTSIG:0.5 Milliliter(s) SUB-Q Once a Week     No current facility-administered medications for this visit.    ALLERGIES: Latex, Ace inhibitors, and Adhesive [tape]  Family History  Problem Relation Age of Onset   Hypertension Mother    Heart disease Mother    Diabetes Maternal Uncle    Cancer Maternal Uncle        ? type   Breast cancer Neg Hx     Review of Systems  PHYSICAL EXAM:  LMP 09/14/2007     General appearance: alert, cooperative and appears stated age Head: normocephalic, without  obvious abnormality, atraumatic Neck: no adenopathy, supple, symmetrical, trachea midline and thyroid normal to inspection and palpation Lungs: clear to auscultation bilaterally Breasts: normal appearance, no masses or tenderness, No nipple retraction or dimpling, No nipple discharge or bleeding, No axillary adenopathy Heart: regular rate and rhythm Abdomen: soft, non-tender; no masses, no organomegaly Extremities: extremities normal, atraumatic, no cyanosis or edema Skin: skin color, texture, turgor normal. No rashes or lesions Lymph nodes: cervical, supraclavicular, and axillary nodes normal. Neurologic: grossly normal  Pelvic: External genitalia:  no lesions              No abnormal inguinal nodes palpated.              Urethra:  normal appearing urethra with no masses, tenderness or lesions              Bartholins and Skenes: normal                 Vagina: normal appearing vagina with normal color and discharge, no lesions              Cervix: no lesions              Pap taken: {yes no:314532} Bimanual Exam:  Uterus:  normal size, contour, position, consistency, mobility, non-tender              Adnexa: no mass, fullness, tenderness              Rectal exam: {yes no:314532}.  Confirms.              Anus:  normal sphincter tone, no lesions  Chaperone was present for exam:  {BSCHAPERONE:31226::"Sharnay Cashion F, CMA"}  ASSESSMENT: Well woman visit with gynecologic exam  ***  PLAN: Mammogram screening discussed. Self breast awareness reviewed. Pap and HRV collected:  {yes no:314532} Guidelines for Calcium, Vitamin D, regular exercise program including cardiovascular and weight bearing exercise. Medication refills:  *** {LABS (Optional):23779} Follow up:  ***    Additional counseling given.  {yes [id number]. ***  total time was spent for this patient encounter, including preparation, face-to-face counseling with the patient, coordination of care, and documentation of the encounter in  addition to doing the well woman visit with gynecologic exam.

## 2023-05-23 ENCOUNTER — Ambulatory Visit: Payer: BC Managed Care – PPO | Admitting: Obstetrics and Gynecology

## 2023-05-24 DIAGNOSIS — Z961 Presence of intraocular lens: Secondary | ICD-10-CM | POA: Diagnosis not present

## 2023-05-24 DIAGNOSIS — H2512 Age-related nuclear cataract, left eye: Secondary | ICD-10-CM | POA: Diagnosis not present

## 2023-05-24 DIAGNOSIS — H401121 Primary open-angle glaucoma, left eye, mild stage: Secondary | ICD-10-CM | POA: Diagnosis not present

## 2023-05-24 DIAGNOSIS — H401113 Primary open-angle glaucoma, right eye, severe stage: Secondary | ICD-10-CM | POA: Diagnosis not present

## 2023-05-30 DIAGNOSIS — I10 Essential (primary) hypertension: Secondary | ICD-10-CM | POA: Diagnosis not present

## 2023-05-30 DIAGNOSIS — E7800 Pure hypercholesterolemia, unspecified: Secondary | ICD-10-CM | POA: Diagnosis not present

## 2023-05-30 DIAGNOSIS — G4733 Obstructive sleep apnea (adult) (pediatric): Secondary | ICD-10-CM | POA: Diagnosis not present

## 2023-05-30 DIAGNOSIS — Z0000 Encounter for general adult medical examination without abnormal findings: Secondary | ICD-10-CM | POA: Diagnosis not present

## 2023-06-16 DIAGNOSIS — E041 Nontoxic single thyroid nodule: Secondary | ICD-10-CM | POA: Diagnosis not present

## 2023-06-16 DIAGNOSIS — R221 Localized swelling, mass and lump, neck: Secondary | ICD-10-CM | POA: Diagnosis not present

## 2023-06-20 ENCOUNTER — Other Ambulatory Visit: Payer: Self-pay | Admitting: Internal Medicine

## 2023-06-20 DIAGNOSIS — K30 Functional dyspepsia: Secondary | ICD-10-CM | POA: Diagnosis not present

## 2023-06-20 DIAGNOSIS — K5909 Other constipation: Secondary | ICD-10-CM | POA: Diagnosis not present

## 2023-06-20 DIAGNOSIS — E041 Nontoxic single thyroid nodule: Secondary | ICD-10-CM

## 2023-07-04 ENCOUNTER — Ambulatory Visit
Admission: RE | Admit: 2023-07-04 | Discharge: 2023-07-04 | Disposition: A | Discharge status: Home / Self Care | Payer: BC Managed Care – PPO | Source: Ambulatory Visit | Attending: Internal Medicine | Admitting: Internal Medicine

## 2023-07-04 ENCOUNTER — Other Ambulatory Visit (HOSPITAL_COMMUNITY)
Admission: RE | Admit: 2023-07-04 | Discharge: 2023-07-04 | Disposition: A | Discharge status: Home / Self Care | Payer: BC Managed Care – PPO | Source: Ambulatory Visit | Attending: Diagnostic Radiology | Admitting: Diagnostic Radiology

## 2023-07-04 DIAGNOSIS — E041 Nontoxic single thyroid nodule: Secondary | ICD-10-CM | POA: Diagnosis not present

## 2023-07-06 LAB — CYTOLOGY - NON PAP

## 2023-07-11 DIAGNOSIS — H401121 Primary open-angle glaucoma, left eye, mild stage: Secondary | ICD-10-CM | POA: Diagnosis not present

## 2023-07-11 DIAGNOSIS — Z961 Presence of intraocular lens: Secondary | ICD-10-CM | POA: Diagnosis not present

## 2023-07-11 DIAGNOSIS — H401113 Primary open-angle glaucoma, right eye, severe stage: Secondary | ICD-10-CM | POA: Diagnosis not present

## 2023-07-11 DIAGNOSIS — H2512 Age-related nuclear cataract, left eye: Secondary | ICD-10-CM | POA: Diagnosis not present

## 2023-07-21 ENCOUNTER — Other Ambulatory Visit: Payer: BC Managed Care – PPO

## 2023-09-18 DIAGNOSIS — Z961 Presence of intraocular lens: Secondary | ICD-10-CM | POA: Diagnosis not present

## 2023-09-18 DIAGNOSIS — H401121 Primary open-angle glaucoma, left eye, mild stage: Secondary | ICD-10-CM | POA: Diagnosis not present

## 2023-09-18 DIAGNOSIS — H2512 Age-related nuclear cataract, left eye: Secondary | ICD-10-CM | POA: Diagnosis not present

## 2023-09-18 DIAGNOSIS — H401113 Primary open-angle glaucoma, right eye, severe stage: Secondary | ICD-10-CM | POA: Diagnosis not present

## 2023-10-10 ENCOUNTER — Ambulatory Visit: Payer: BC Managed Care – PPO | Admitting: Obstetrics and Gynecology

## 2023-10-17 ENCOUNTER — Ambulatory Visit (INDEPENDENT_AMBULATORY_CARE_PROVIDER_SITE_OTHER): Payer: BC Managed Care – PPO | Admitting: Obstetrics and Gynecology

## 2023-10-17 ENCOUNTER — Encounter: Payer: Self-pay | Admitting: Obstetrics and Gynecology

## 2023-10-17 ENCOUNTER — Other Ambulatory Visit: Payer: Self-pay | Admitting: Internal Medicine

## 2023-10-17 VITALS — BP 118/82 | HR 68 | Ht 64.75 in | Wt 196.0 lb

## 2023-10-17 DIAGNOSIS — N952 Postmenopausal atrophic vaginitis: Secondary | ICD-10-CM | POA: Diagnosis not present

## 2023-10-17 DIAGNOSIS — Z01419 Encounter for gynecological examination (general) (routine) without abnormal findings: Secondary | ICD-10-CM | POA: Diagnosis not present

## 2023-10-17 DIAGNOSIS — Z1231 Encounter for screening mammogram for malignant neoplasm of breast: Secondary | ICD-10-CM

## 2023-10-17 DIAGNOSIS — Z1331 Encounter for screening for depression: Secondary | ICD-10-CM

## 2023-10-17 MED ORDER — ESTRADIOL 10 MCG VA TABS: ORAL_TABLET | VAGINAL | 3 refills | Status: AC

## 2023-10-17 NOTE — Progress Notes (Unsigned)
 58 y.o. G76P0010 Married [place] American female here for annual exam.    Did not use vaginal estrogen tablets.  Not sexually activity.   Taking Wegovy.  Back from [place].  Did a lot of adventures.   Husband passed away 3 years ago.  He had diabetes. Caring for her mother who has dementia.   PCP: Merl Star, MD   Patient's last menstrual period was 09/14/2007.           Sexually active: No.  The current method of family planning is status post hysterectomy.    Menopausal hormone therapy:  None.  Exercising: No.   Smoker:  Former  OB History  Gravida Para Term Preterm AB Living  1    1 0  SAB IAB Ectopic Multiple Live Births  1        # Outcome Date GA Lbr Len/2nd Weight Sex Type Anes PTL Lv  1 SAB              HEALTH MAINTENANCE: Last 2 paps:  12/12/16 neg, 07/09/14 neg History of abnormal Pap or positive HPV:  no Mammogram:   10/21/22 Breast Density Cat C, BIRADS Cat 1 neg.  Had appointment.  Colonoscopy: 2023 [facility].   Bone Density:  10/07/09  Result  normal   Immunization History  Administered Date(s) Administered   Influenza Inj Mdck Quad Pf 03/25/2017   Influenza,inj,Quad PF,6+ Mos 02/02/2019      reports that she quit smoking about 32 years ago. Her smoking use included cigarettes. She has never used smokeless tobacco. She reports that she does not currently use alcohol. She reports that she does not use drugs.  Past Medical History:  Diagnosis Date   Elevated cholesterol    Glaucoma    Hypertension    Prediabetes    Sleep apnea    Using CPAP    Past Surgical History:  Procedure Laterality Date   ABDOMINAL HYSTERECTOMY  2009   TAH,BSO   CATARACT EXTRACTION     EYE SURGERY  05/2010   RIGHT   HEEL SPUR SURGERY  OCT 2009   MULTIPLE MYOMECTOMY  2001   OOPHORECTOMY     BSO   PELVIC LAPAROSCOPY      Current Outpatient Medications  Medication Sig Dispense Refill   acetaminophen  (TYLENOL ) 500 MG tablet Take 1,000 mg by mouth daily.      amLODipine  (NORVASC ) 10 MG tablet Take 1 tablet (10 mg total) by mouth daily. 30 tablet 0   Ascorbic Acid (VITAMIN C) 1000 MG tablet Take 1,000 mg by mouth daily.     cetirizine  (ZYRTEC  ALLERGY) 10 MG tablet Take 1 tablet (10 mg total) by mouth daily. 30 tablet 0   Cholecalciferol (VITAMIN D3) 25 MCG (1000 UT) CAPS 1 capsule.     Cyanocobalamin  1000 MCG SUBL as directed Orally     diclofenac  Sodium (VOLTAREN ) 1 % GEL Apply 2 g topically 4 (four) times daily. Rub into affected area of foot 2 to 4 times daily 100 g 2   dorzolamide-timolol (COSOPT) 22.3-6.8 MG/ML ophthalmic solution 1 drop 2 (two) times daily.     EPINEPHrine  0.3 mg/0.3 mL IJ SOAJ injection Inject 0.3 mg into the muscle as needed for anaphylaxis. 1 each 0   escitalopram (LEXAPRO) 20 MG tablet Take 20 mg by mouth daily.     esomeprazole (NEXIUM) 40 MG capsule Take 40 mg by mouth daily.     famotidine  (PEPCID ) 20 MG tablet Take 1 tablet (20 mg  total) by mouth 2 (two) times daily. 10 tablet 0   hydrochlorothiazide (HYDRODIURIL) 25 MG tablet Take 25 mg by mouth daily.     latanoprost (XALATAN) 0.005 % ophthalmic solution Place 1 drop into both eyes at bedtime.     meloxicam  (MOBIC ) 15 MG tablet Take 15 mg by mouth daily.     Multiple Vitamin (MULTIVITAMIN WITH MINERALS) TABS tablet Take 1 tablet by mouth daily.     ROCKLATAN 0.02-0.005 % SOLN Apply 1 drop to eye at bedtime.     rosuvastatin (CRESTOR) 10 MG tablet Take 10 mg by mouth at bedtime.     timolol (BETIMOL) 0.5 % ophthalmic solution 1 drop 2 (two) times daily.     WEGOVY 1 MG/0.5ML SOAJ SMARTSIG:0.5 Milliliter(s) SUB-Q Once a Week     Estradiol  (VAGIFEM ) 10 MCG TABS vaginal tablet Place one tablet (10 mcg) per vagina every night before bed for two weeks when you first begin this medicine, then after the first two weeks, begin using it twice a week. (Patient not taking: Reported on 10/17/2023) 34 tablet 3   triamcinolone  cream (KENALOG ) 0.1 % Apply 1 application topically 2  (two) times daily. (Patient not taking: Reported on 10/17/2023) 30 g 0   No current facility-administered medications for this visit.    ALLERGIES: Latex, Ace inhibitors, and Adhesive [tape]  Family History  Problem Relation Age of Onset   Hypertension Mother    Heart disease Mother    Diabetes Maternal Uncle    Cancer Maternal Uncle        ? type   Breast cancer Neg Hx     Review of Systems  All other systems reviewed and are negative.   PHYSICAL EXAM:  BP 118/82 (BP Location: Left Arm, Patient Position: Sitting)   Pulse 68   Ht 5' 4.75" (1.645 m)   Wt 196 lb (88.9 kg)   LMP 09/14/2007   SpO2 98%   BMI 32.87 kg/m     General appearance: alert, cooperative and appears stated age Head: normocephalic, without obvious abnormality, atraumatic Neck: no adenopathy, supple, symmetrical, trachea midline and thyroid  normal to inspection and palpation Lungs: clear to auscultation bilaterally Breasts: normal appearance, no masses or tenderness, No nipple retraction or dimpling, No nipple discharge or bleeding, No axillary adenopathy Heart: regular rate and rhythm Abdomen: soft, non-tender; no masses, no organomegaly Extremities: extremities normal, atraumatic, no cyanosis or edema Skin: skin color, texture, turgor normal. No rashes or lesions Lymph nodes: cervical, supraclavicular, and axillary nodes normal. Neurologic: grossly normal  Pelvic: External genitalia:  no lesions              No abnormal inguinal nodes palpated.              Urethra:  normal appearing urethra with no masses, tenderness or lesions              Bartholins and Skenes: normal                 Vagina: normal appearing vagina with normal color and discharge, no lesions.  Atrophy noted.               Cervix: absent              Pap taken: no Bimanual Exam:  Uterus:  absent              Adnexa: no mass, fullness, tenderness              Rectal  exam: yes.  Confirms.              Anus:  normal sphincter tone,  no lesions  Chaperone was present for exam:  Cottie Diss, CMA  ASSESSMENT: Well woman visit with gynecologic exam. Status post TAH/BSO - fibroid.  Vaginal atrophy. PHQ-9: 0  PLAN: Mammogram screening discussed. Self breast awareness reviewed. Pap and HRV collected:  no.  Not indicated.  Guidelines for Calcium, Vitamin D , regular exercise program including cardiovascular and weight bearing exercise. Medication refills:  Vagifem .  I discussed potential effect on breast cancer.  Labs with PCP.  Follow up:  yearly and prn.

## 2023-10-17 NOTE — Patient Instructions (Signed)

## 2023-10-23 ENCOUNTER — Ambulatory Visit
Admission: RE | Admit: 2023-10-23 | Discharge: 2023-10-23 | Disposition: A | Discharge status: Home / Self Care | Source: Ambulatory Visit | Attending: Internal Medicine | Admitting: Internal Medicine

## 2023-10-23 DIAGNOSIS — Z1231 Encounter for screening mammogram for malignant neoplasm of breast: Secondary | ICD-10-CM | POA: Diagnosis not present

## 2024-01-23 DIAGNOSIS — Z961 Presence of intraocular lens: Secondary | ICD-10-CM | POA: Diagnosis not present

## 2024-01-23 DIAGNOSIS — H401113 Primary open-angle glaucoma, right eye, severe stage: Secondary | ICD-10-CM | POA: Diagnosis not present

## 2024-01-23 DIAGNOSIS — H401121 Primary open-angle glaucoma, left eye, mild stage: Secondary | ICD-10-CM | POA: Diagnosis not present

## 2024-01-23 DIAGNOSIS — H2512 Age-related nuclear cataract, left eye: Secondary | ICD-10-CM | POA: Diagnosis not present

## 2024-02-22 ENCOUNTER — Other Ambulatory Visit: Payer: Self-pay | Admitting: Internal Medicine

## 2024-02-22 DIAGNOSIS — Z1231 Encounter for screening mammogram for malignant neoplasm of breast: Secondary | ICD-10-CM

## 2024-03-12 DIAGNOSIS — K581 Irritable bowel syndrome with constipation: Secondary | ICD-10-CM | POA: Diagnosis not present

## 2024-03-12 DIAGNOSIS — K30 Functional dyspepsia: Secondary | ICD-10-CM | POA: Diagnosis not present

## 2024-03-12 DIAGNOSIS — Z800 Family history of malignant neoplasm of digestive organs: Secondary | ICD-10-CM | POA: Diagnosis not present

## 2024-03-26 DIAGNOSIS — H401113 Primary open-angle glaucoma, right eye, severe stage: Secondary | ICD-10-CM | POA: Diagnosis not present

## 2024-03-26 DIAGNOSIS — H401121 Primary open-angle glaucoma, left eye, mild stage: Secondary | ICD-10-CM | POA: Diagnosis not present

## 2024-03-26 DIAGNOSIS — Z961 Presence of intraocular lens: Secondary | ICD-10-CM | POA: Diagnosis not present

## 2024-03-26 DIAGNOSIS — H209 Unspecified iridocyclitis: Secondary | ICD-10-CM | POA: Diagnosis not present

## 2024-04-02 DIAGNOSIS — H401113 Primary open-angle glaucoma, right eye, severe stage: Secondary | ICD-10-CM | POA: Diagnosis not present

## 2024-04-02 DIAGNOSIS — H401121 Primary open-angle glaucoma, left eye, mild stage: Secondary | ICD-10-CM | POA: Diagnosis not present

## 2024-04-02 DIAGNOSIS — H209 Unspecified iridocyclitis: Secondary | ICD-10-CM | POA: Diagnosis not present

## 2024-04-02 DIAGNOSIS — Z961 Presence of intraocular lens: Secondary | ICD-10-CM | POA: Diagnosis not present

## 2024-04-23 DIAGNOSIS — H401113 Primary open-angle glaucoma, right eye, severe stage: Secondary | ICD-10-CM | POA: Diagnosis not present

## 2024-04-23 DIAGNOSIS — H401121 Primary open-angle glaucoma, left eye, mild stage: Secondary | ICD-10-CM | POA: Diagnosis not present

## 2024-04-23 DIAGNOSIS — Z961 Presence of intraocular lens: Secondary | ICD-10-CM | POA: Diagnosis not present

## 2024-04-23 DIAGNOSIS — H209 Unspecified iridocyclitis: Secondary | ICD-10-CM | POA: Diagnosis not present

## 2024-10-21 ENCOUNTER — Ambulatory Visit: Admitting: Obstetrics and Gynecology

## 2024-10-23 ENCOUNTER — Ambulatory Visit
